# Patient Record
Sex: Female | Born: 1940 | Race: White | Hispanic: No | Marital: Married | Smoking: Never smoker
Health system: Southern US, Academic
[De-identification: ages and names within clinical notes are randomized; demographics above are authoritative.]

---

## 1992-04-19 ENCOUNTER — Other Ambulatory Visit (HOSPITAL_COMMUNITY): Payer: Self-pay | Admitting: ORTHOPAEDIC SURGERY

## 2015-10-13 IMAGING — MG MAMMO DIGITAL SCREENING
1 series · 4 of 4 positions shown · non-contrast
Comparison: Exam dated 01/31/14 and 04/02/15.

------------- REPORT [id number] -------------
MAMMO DIGITAL SCREENING WITH CAD
Exam:
Bilateral digital screening mammogram with CAD
HISTORY: Asymptomatic 74-year-old with family history of breast cancer in her sister.

[R CC · oblique · right · 4 of 4 slices shown]
[im 1/4]
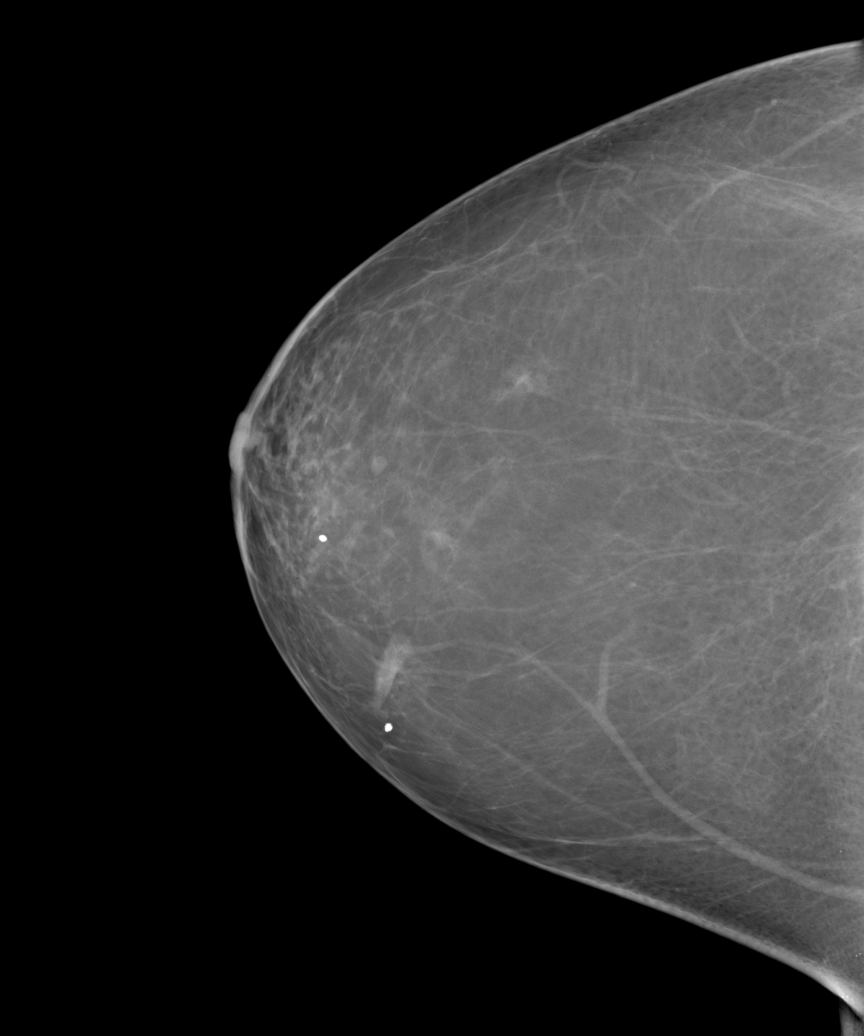
[im 2/4]
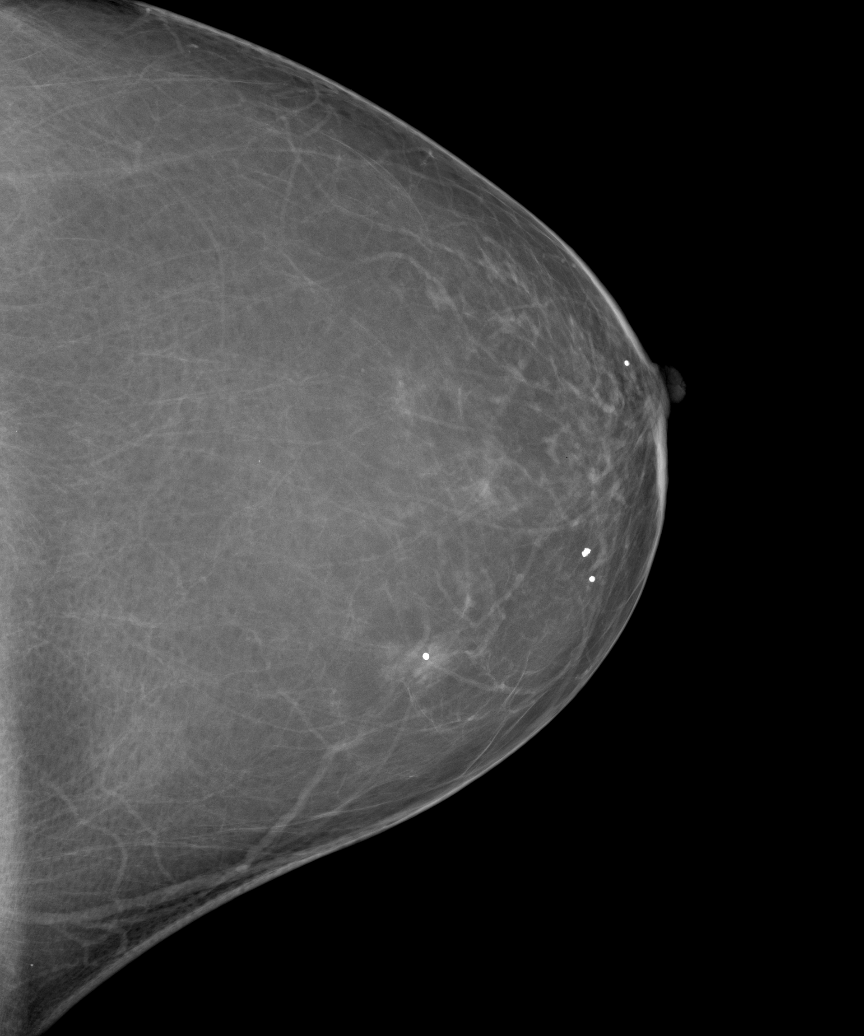
[im 3/4]
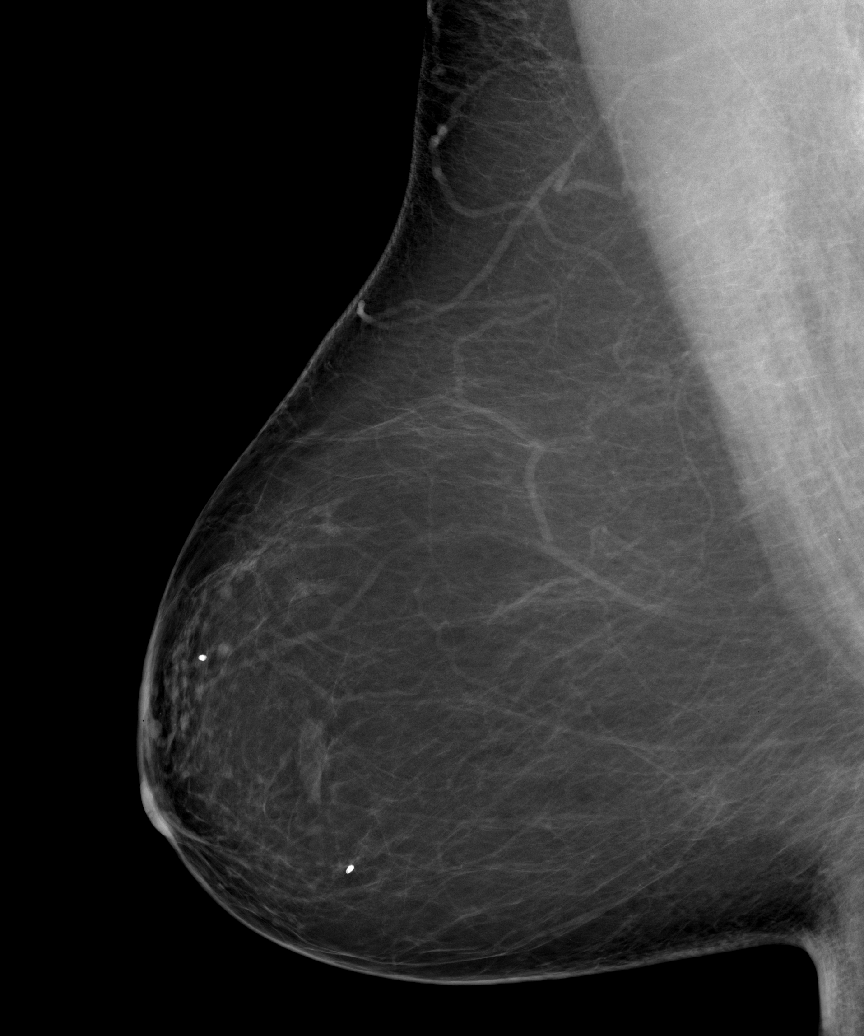
[im 4/4]
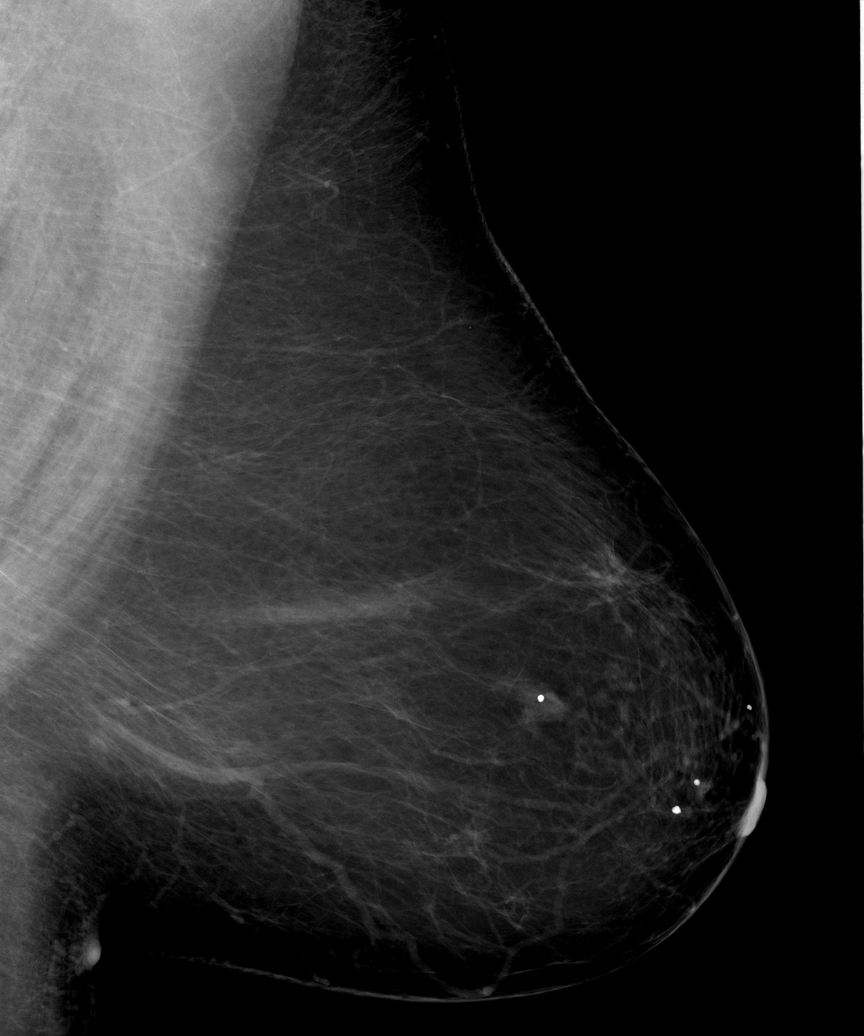

[4 of 4 positions shown; findings below may reference images not displayed]

FINDINGS: No new masses or architectural changes are seen. Small focal asymmetric densities in the medial aspect of each breast are stable in appearance. No new masses, architectural changes are seen. Benign calcific densities of both breasts are stable. No skin changes, nipple changes or duct dilation are noted.
IMPRESSION: 1.
Stable mammographic findings including patchy focal asymmetry of medial breast on each side. 
2.
Clinical followup and mammographic followup at 12 months are recommended. 

Final Assessment Code:
Bi-Rads 2, density category B

BI-RADS 0
Need additional imaging evaluation
BI-RADS 1
Negative mammogram
BI-RADS 2
Benign finding
BI-RADS 3
Probably benign finding: short-interval follow-up suggested
BI-RADS 4
Suspicious abnormality:  biopsy should be considered
BI-RADS 5
Highly suggestive of malignancy; appropriate action should be taken
BI-RADS 6
Known Biopsy-proven Malignancy  Appropriate action should be taken
NOTE:
In compliance with Federal regulations, the results of this mammogram are being sent to the patient.

------------- REPORT [id number] -------------
[facility]
[address]
Daina Ms.HENJAK, LJAMA:
We wish to report the following on your recent mammography examination. We are sending a report to your referring physician or other health care provider. 
(       Normal/Negative:
No evidence of cancer.
This statement is mandated by the [place], Department of Health.
Your examination was performed by one of our technologists, who are registered radiological technologists and also specially certified in mammography:
___
Parlak, Edaly (M)
___
Dang, Mcalex (M)

Your mammogram was interpreted by our radiologist.

( 
Sofeine Made, M.D.

(Annual Breast Examination by a physician or other health care provider
(Annual Mammography Screening beginning at age 40
(Monthly Breast Self Examination

## 2017-09-29 IMAGING — MG 3D SCREENING MAMMO BIL W/CAD
5 series · 7 of 24 positions shown · non-contrast
Comparison: 03/23/2016 and 03/22/2015.

------------- REPORT [id number] -------------
[facility]
[address]
Daina Ms.HENJAK, LJAMA:
We wish to report the following on your recent mammography examination. We are sending a report to your referring physician or other health care provider. 
(       Normal/Negative:
No evidence of cancer.
This statement is mandated by the [place], Department of Health.
Your examination was performed by one of our technologists, who are registered radiological technologists and also specially certified in mammography:
___
Parlak, Edaly (M)
Dang, Mcalex (M)

Your mammogram was interpreted by our radiologist.
( 
Sofeine Made, M.D.
(Annual Breast Examination by a physician or other health care provider
(Annual Mammography Screening beginning at age 40
(Monthly Breast Self Examination
------------- REPORT [id number] -------------
Exam:  
Annual Screening Digital Mammogram with 3D Tomosynthesis with CAD
INDICATION: Screening.

[R]
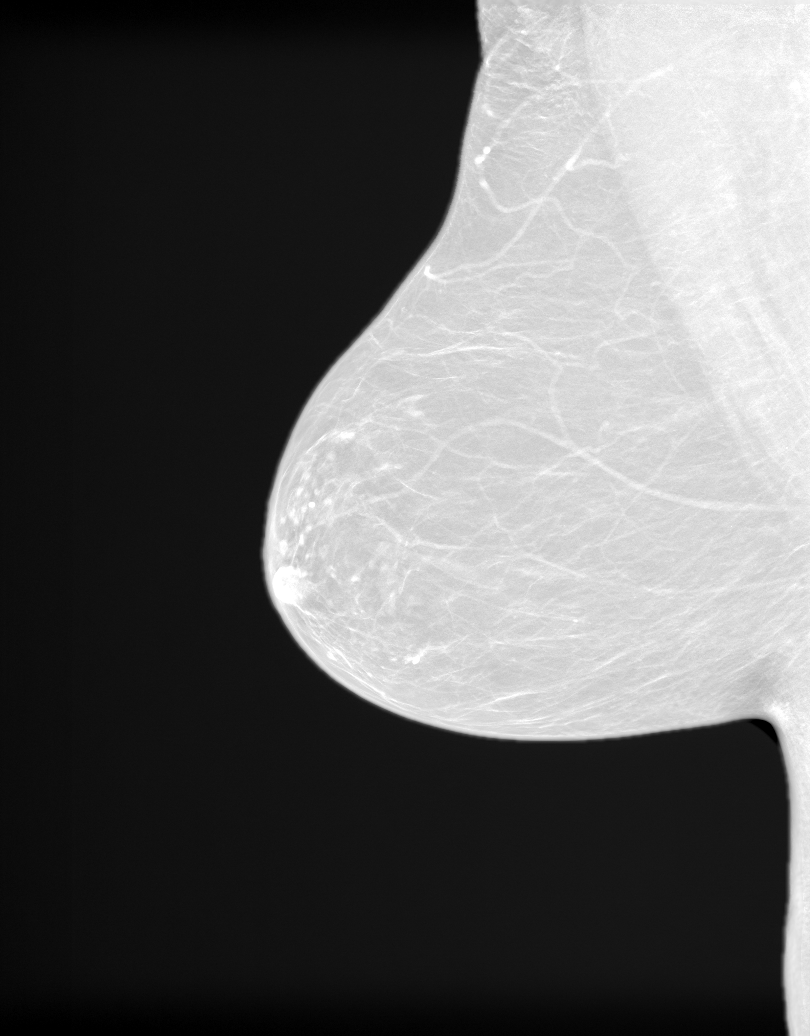

[R CC · right · 0.10mm/px · 2 of 2 slices shown]
[im 1/2]
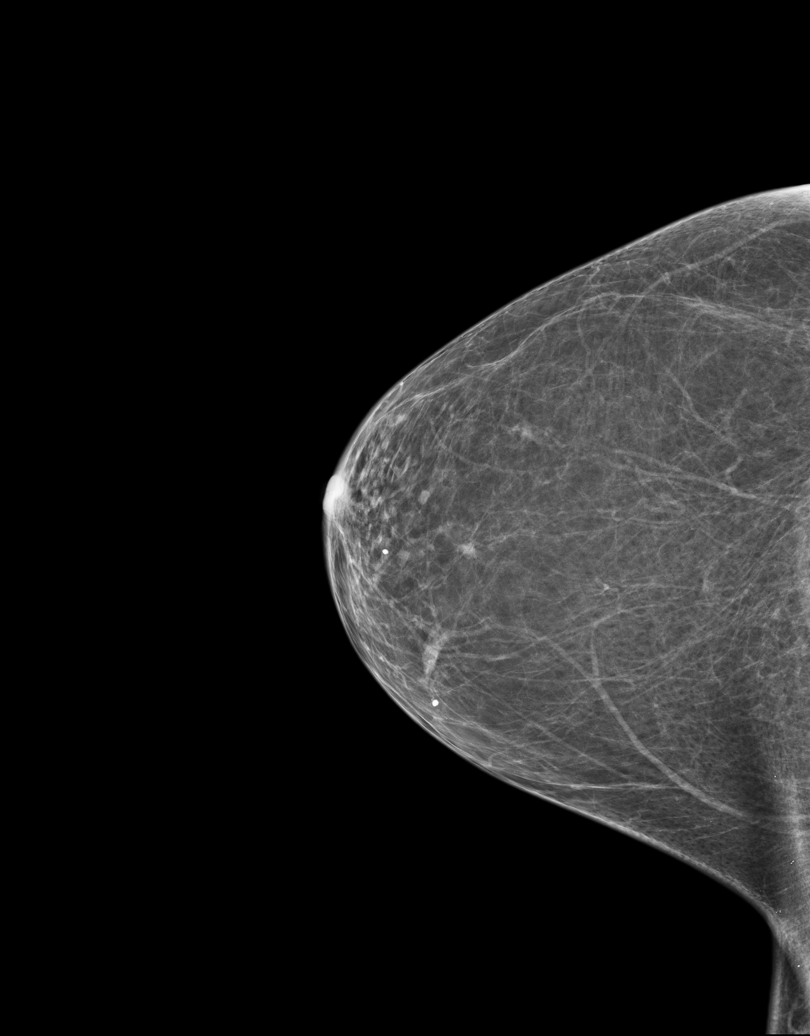
[im 2/2]
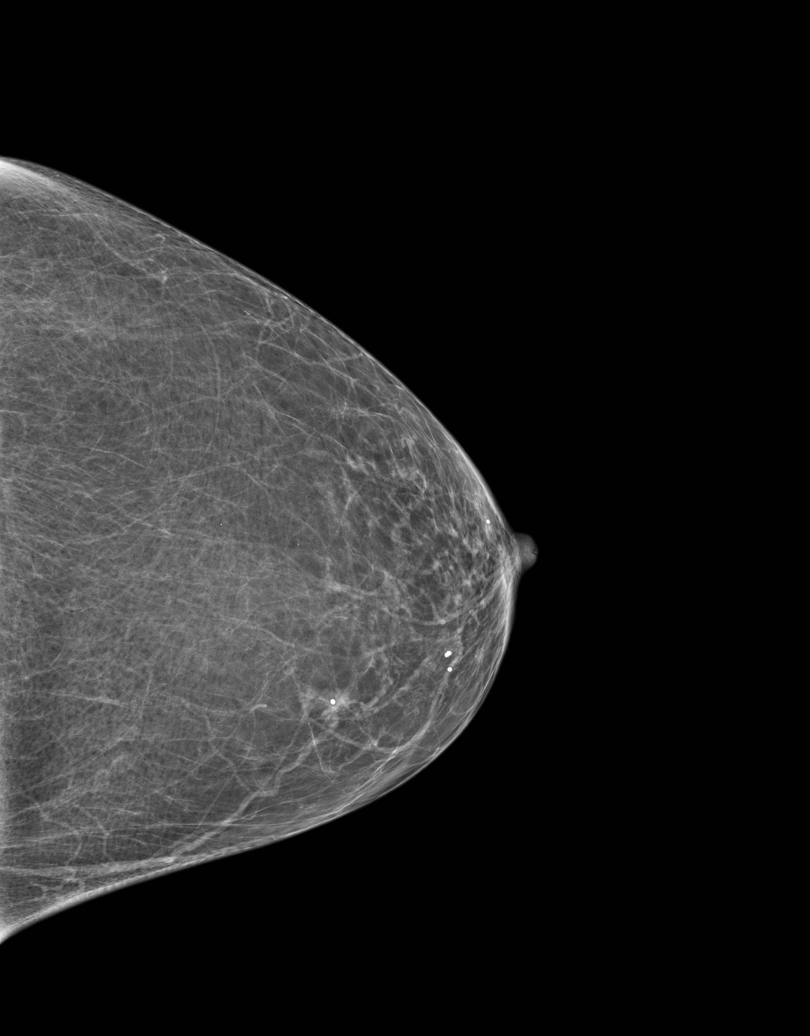

[3D SCREENING MAMMO BIL W/CAD · 2 acquisitions, 2 frames shown (1 of 2)]
[im 1/2]
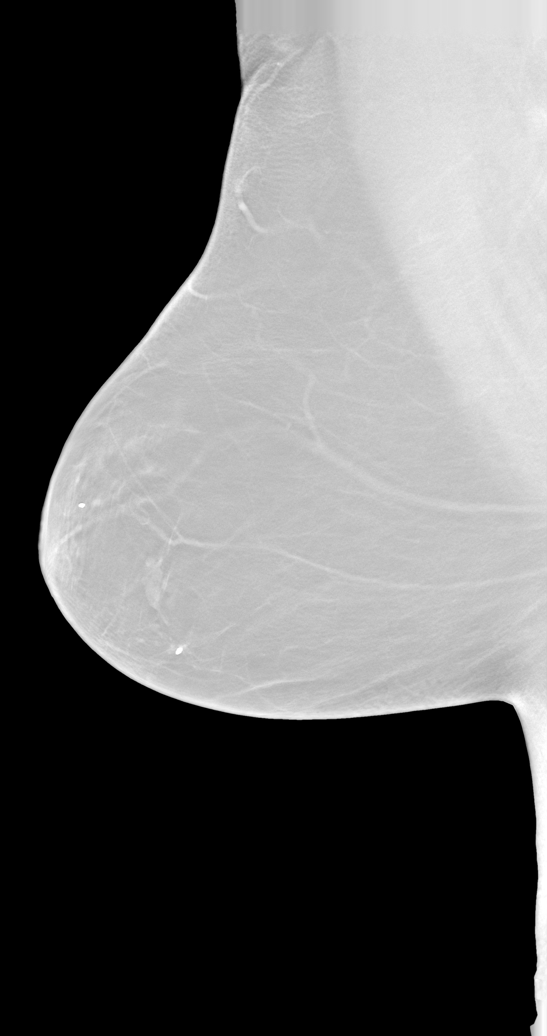
[im 2/2]
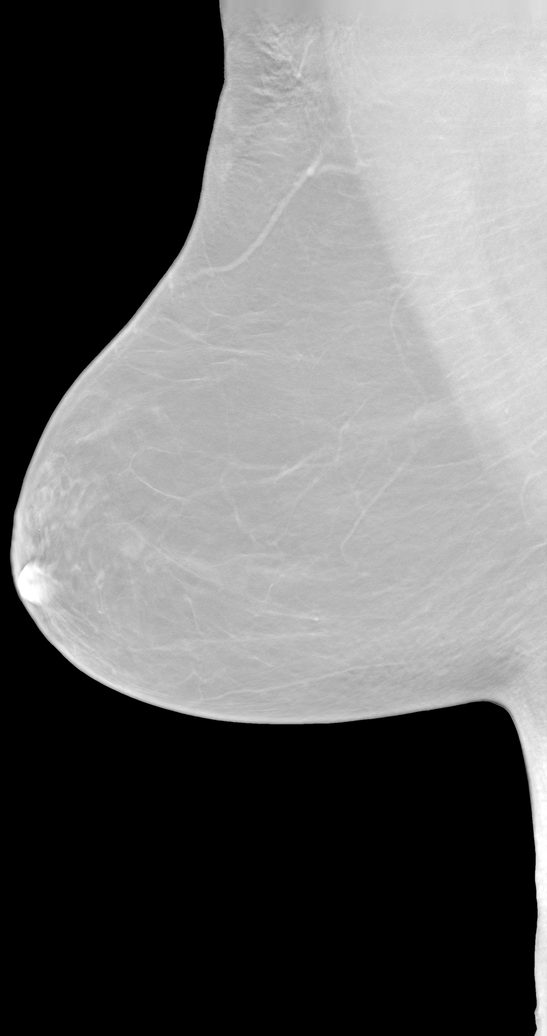

[3D SCREENING MAMMO BIL W/CAD (2 of 2) · tomo slice 9/55.0]
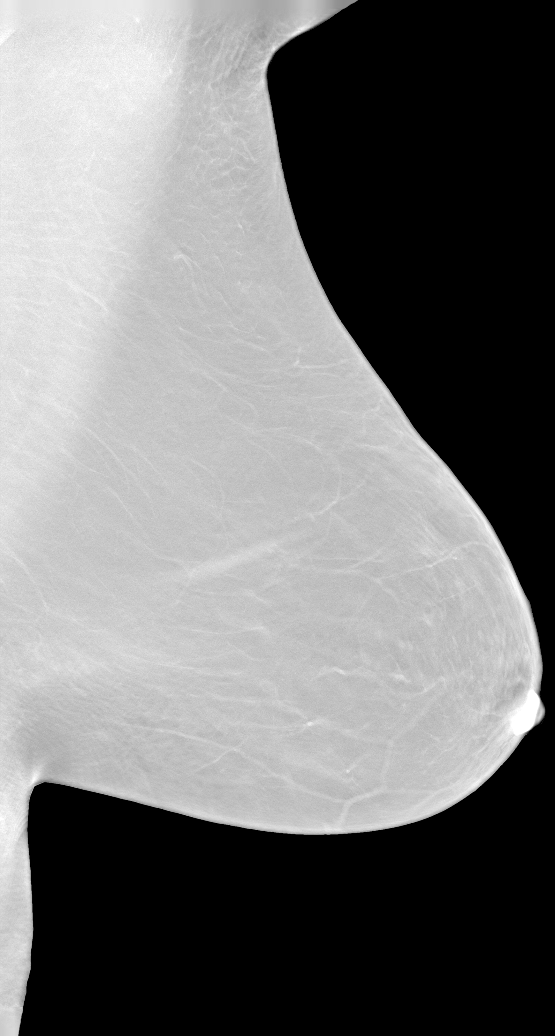

[L]
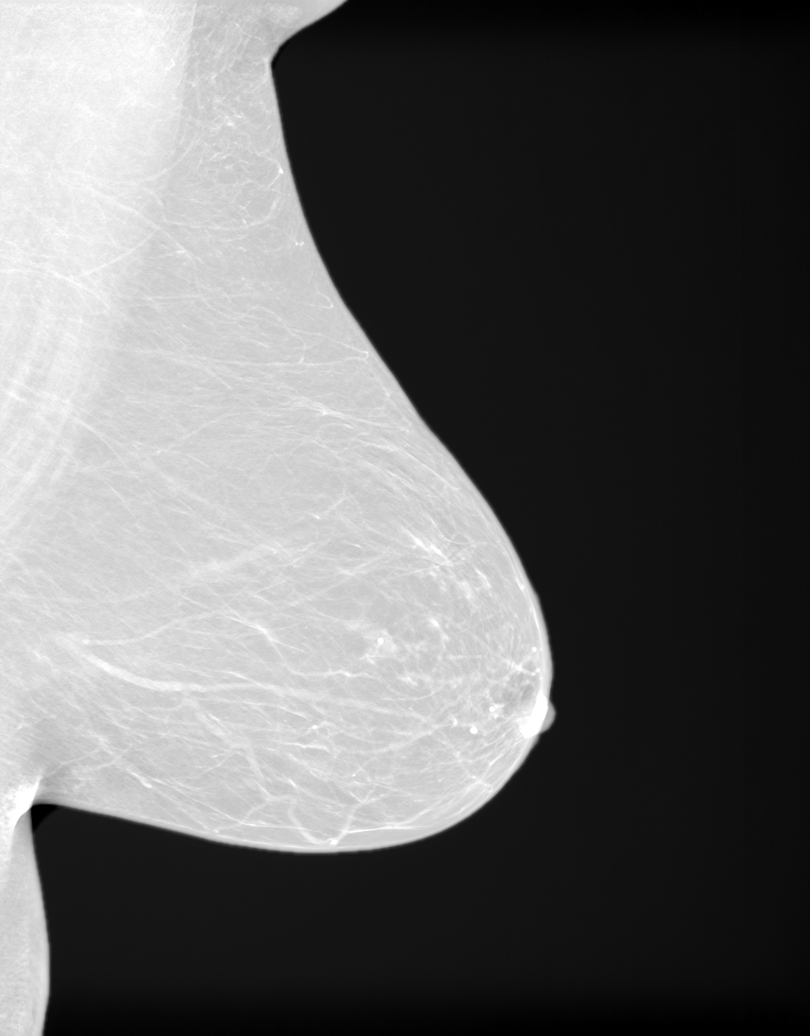

[7 of 24 positions shown; findings below may reference images not displayed]

FINDINGS: There are scattered fibroglandular elements.  There is no mass or suspicious cluster of microcalcifications.   There is no architectural distortion, skin thickening or nipple retraction.
IMPRESSION: BIRADS 2-Benign findings. Patient has been added in a reminder system with a

target date for the next screening mammography.

DENSITY CODE –  B (Scattered areas of fibroglandular density). 

Final Assessment Code:

Bi-Rads 2 

BI-RADS 0
Need additional imaging evaluation

BI-RADS 1
Negative mammogram

BI-RADS 2
Benign finding

BI-RADS 3
Probably benign finding: short-interval follow-up suggested

BI-RADS 4
Suspicious abnormality:  biopsy should be considered

BI-RADS 5
Highly suggestive of malignancy; appropriate action should be taken

BI-RADS 6
Known Biopsy-proven Malignancy – Appropriate action should be taken

NOTE:
In compliance with Federal regulations, the results of this mammogram are being sent to the patient.

## 2018-03-16 IMAGING — CR XRAY LUMBAR SPINE COMPLETE
1 series · 6 of 6 positions shown · non-contrast
Comparison: None available.

EXAM:  XRAY LUMBAR SPINE COMPLETE
INDICATION: Low back pain.

[Series 3: view not recorded · 0.17mm/px · 6 of 6 slices shown]
[im 1/6]
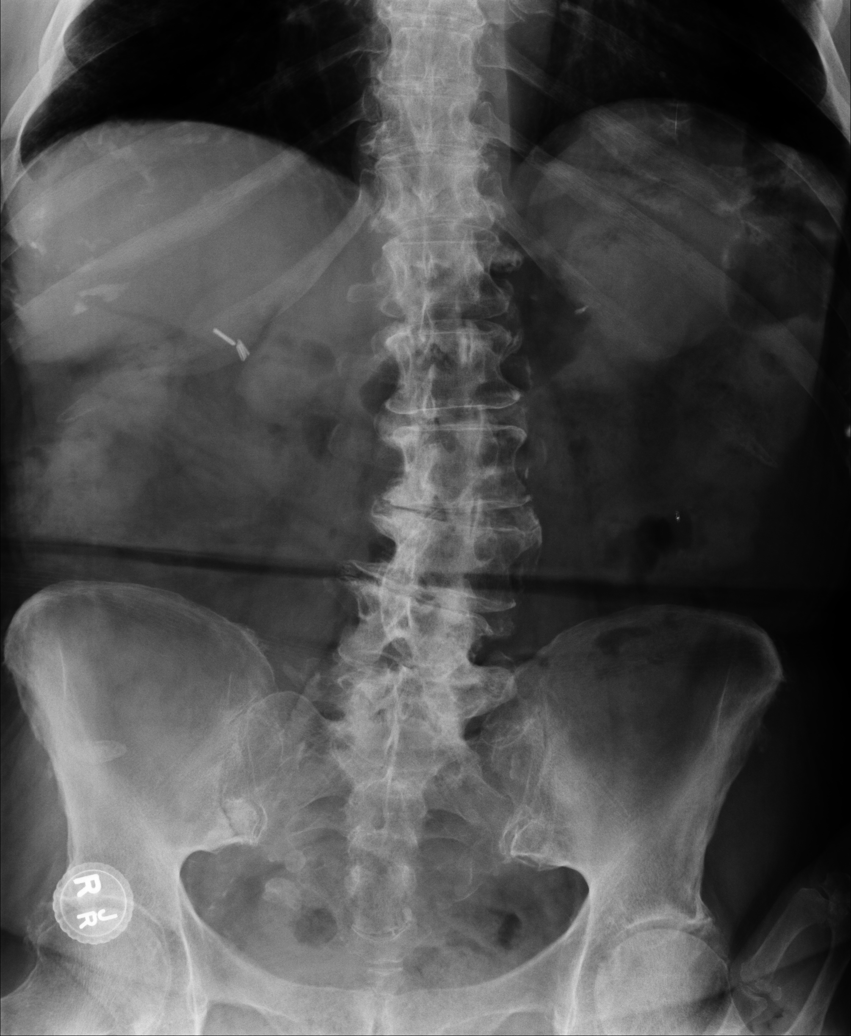
[im 2/6]
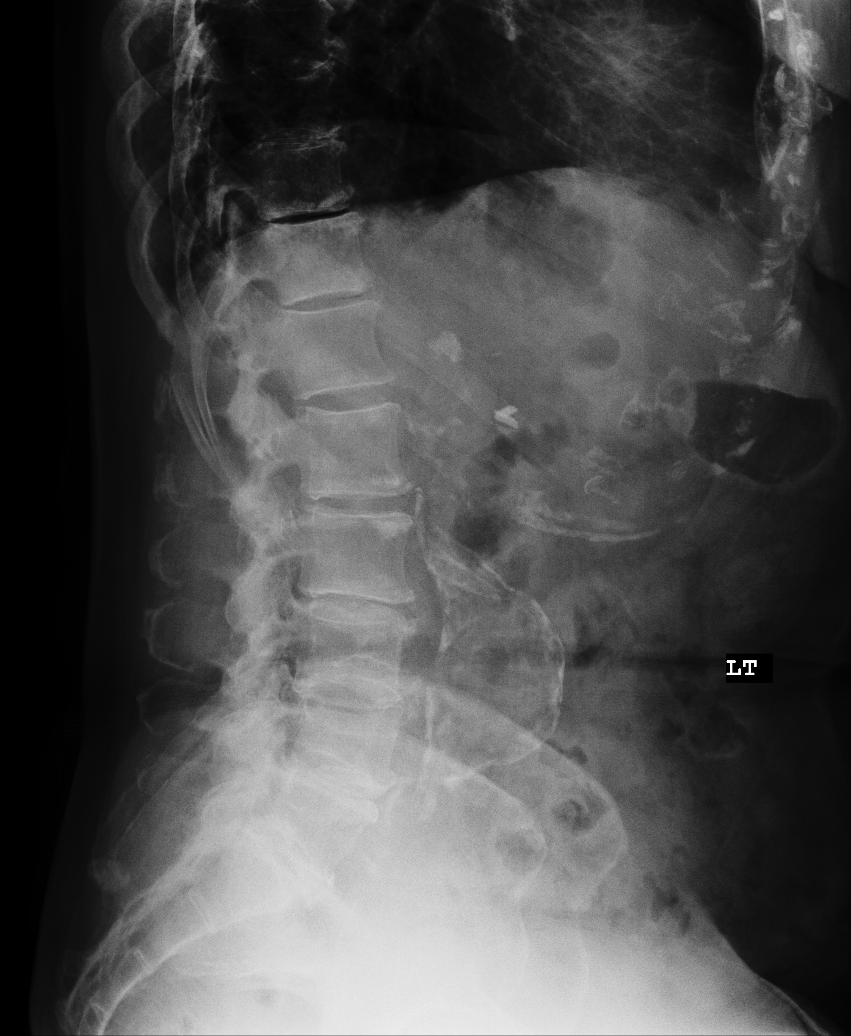
[im 3/6]
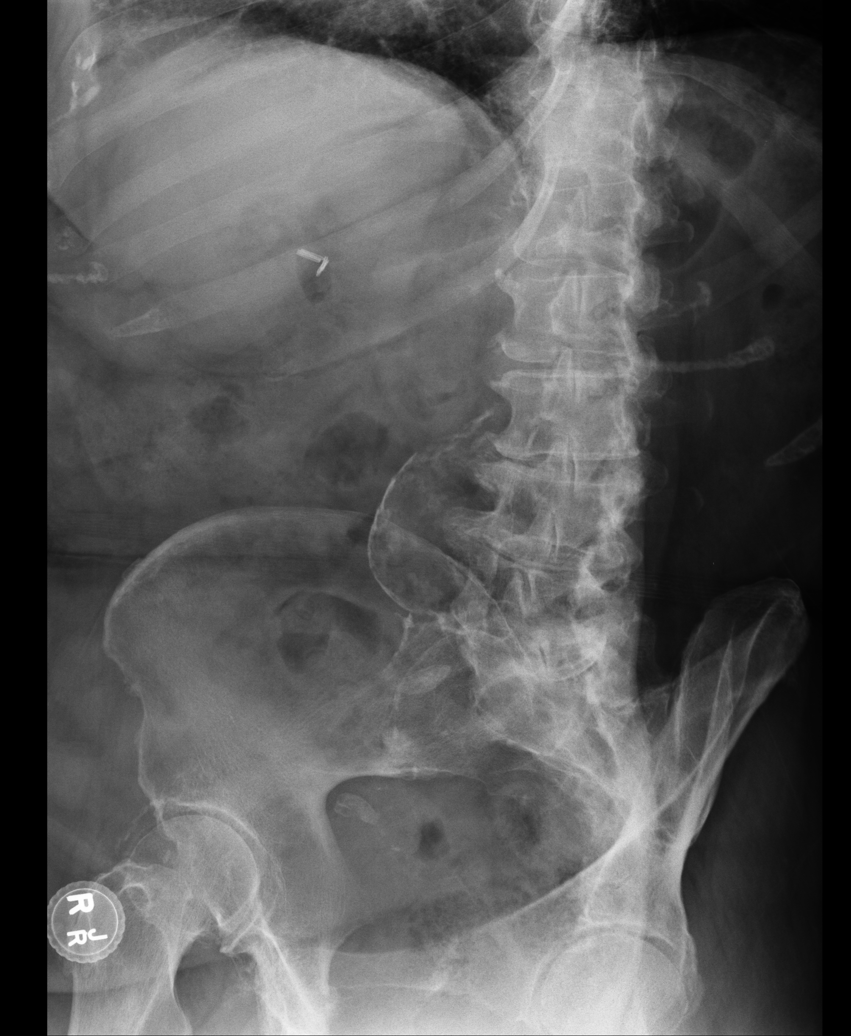
[im 4/6]
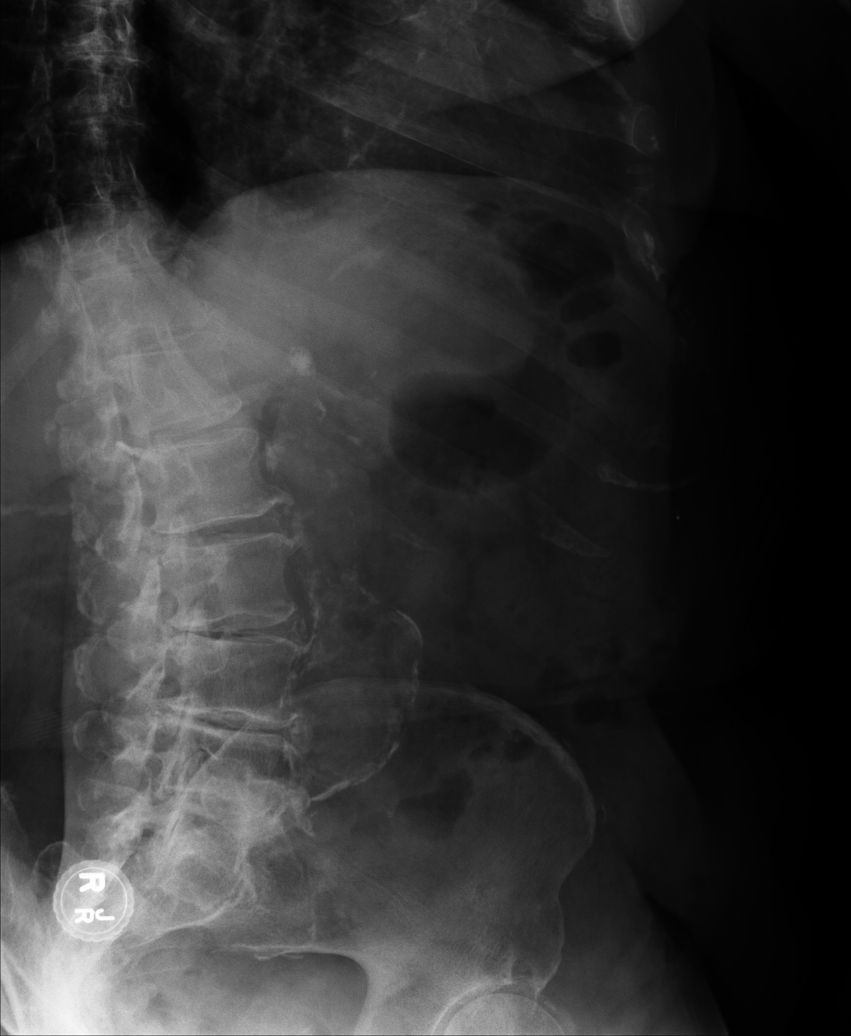
[im 5/6]
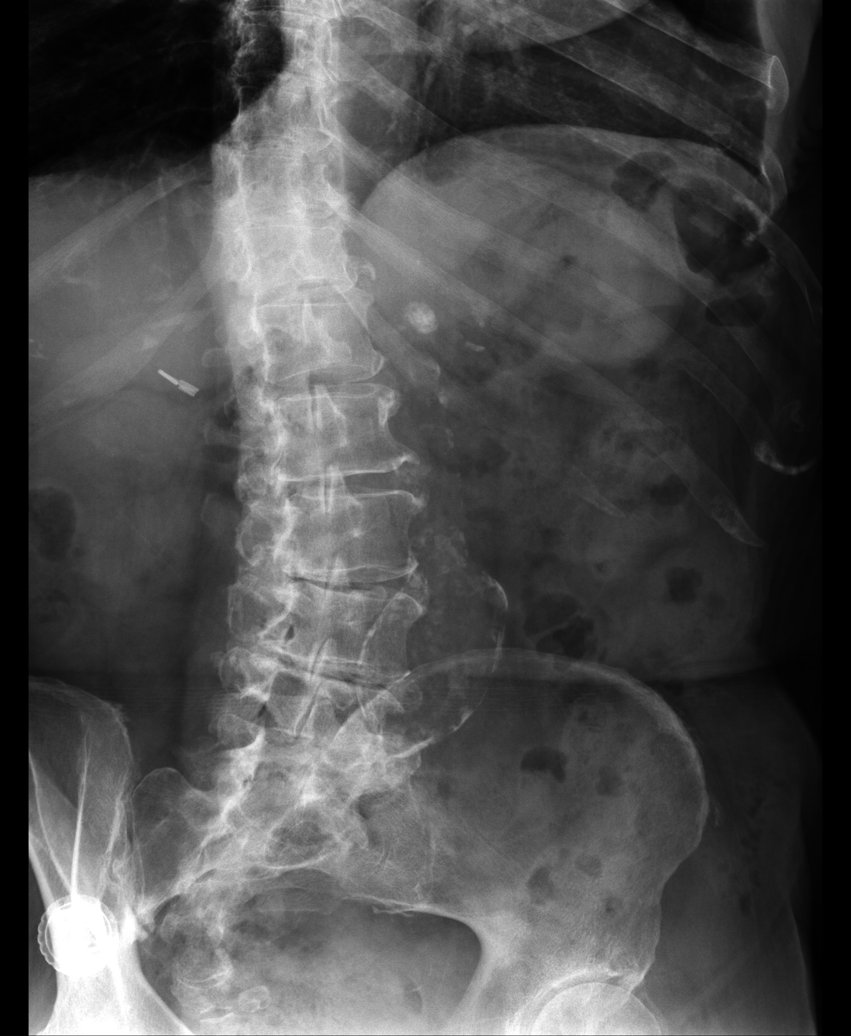
[im 6/6]
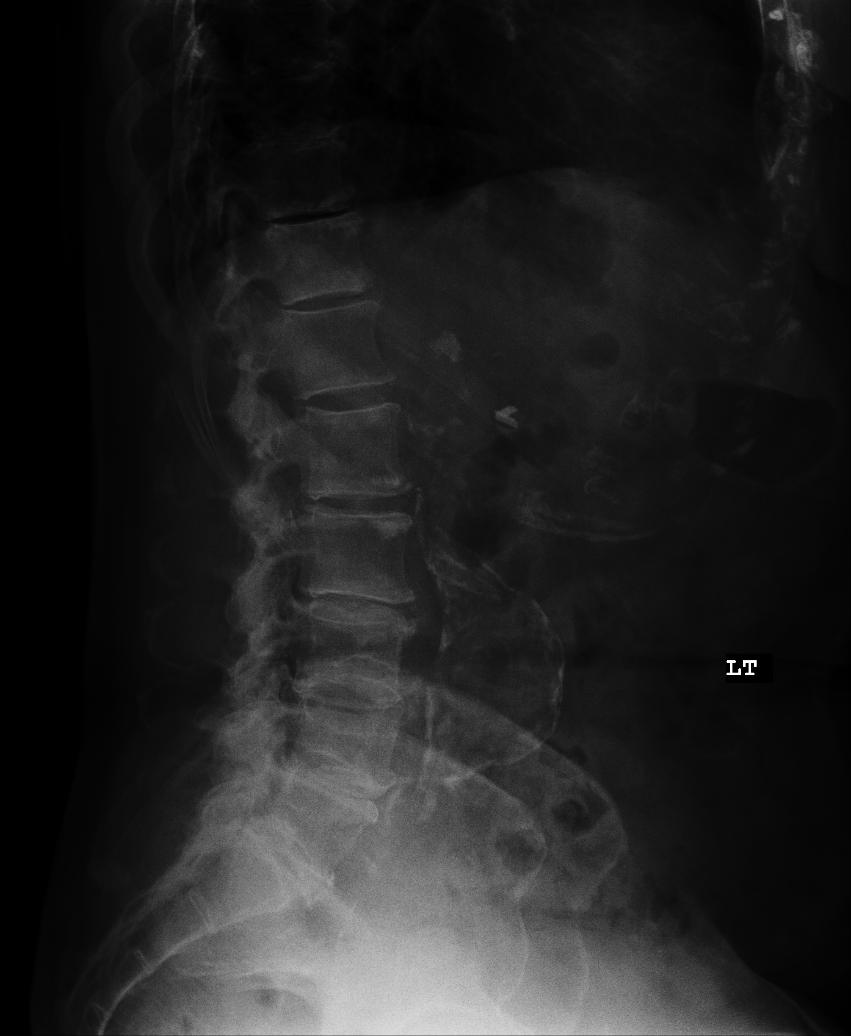

[6 of 6 positions shown; findings below may reference images not displayed]

FINDINGS: There are 6 non-rib-bearing lumbar vertebral bodies. There is a mild levoscoliosis centered at L3-4 disc space level. There is no acute fracture or subluxation. Moderate to severe degenerative disc disease is seen at L3-4, L4-5, L5-6 and L6-S1 levels. There is also extensive facet arthropathy within the lower lumbar spine. Paraspinal soft tissues are unremarkable. There is extensive atherosclerotic disease. There is also a 5.5 cm heavily calcified abdominal aortic aneurysm. Cholecystectomy clips are also noted.
IMPRESSION: Advanced multilevel arthritic changes as detailed above.

A 5.5 cm heavily calcified abdominal aortic aneurysm.

## 2018-03-16 IMAGING — CR XRAY THORACIC SPINE 2 VIEWS
1 series · 4 of 4 positions shown · non-contrast
Comparison: None available.

EXAM:  XRAY THORACIC SPINE 2 VIEWS
INDICATION: Mid back pain.

[Series 1: view not recorded · 0.17mm/px · 4 of 4 slices shown]
[im 1/4]
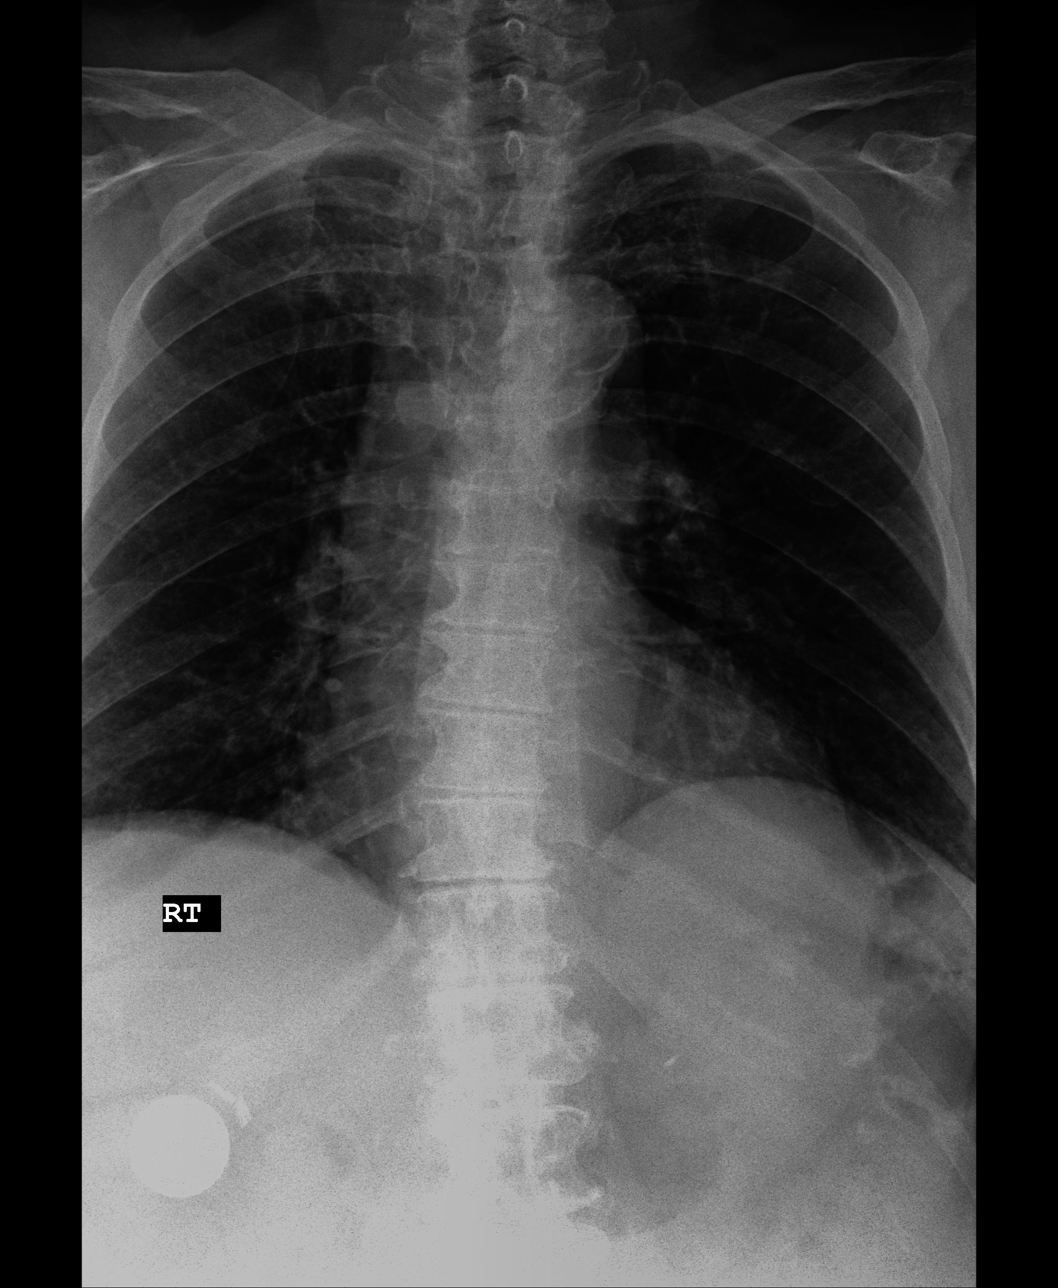
[im 2/4]
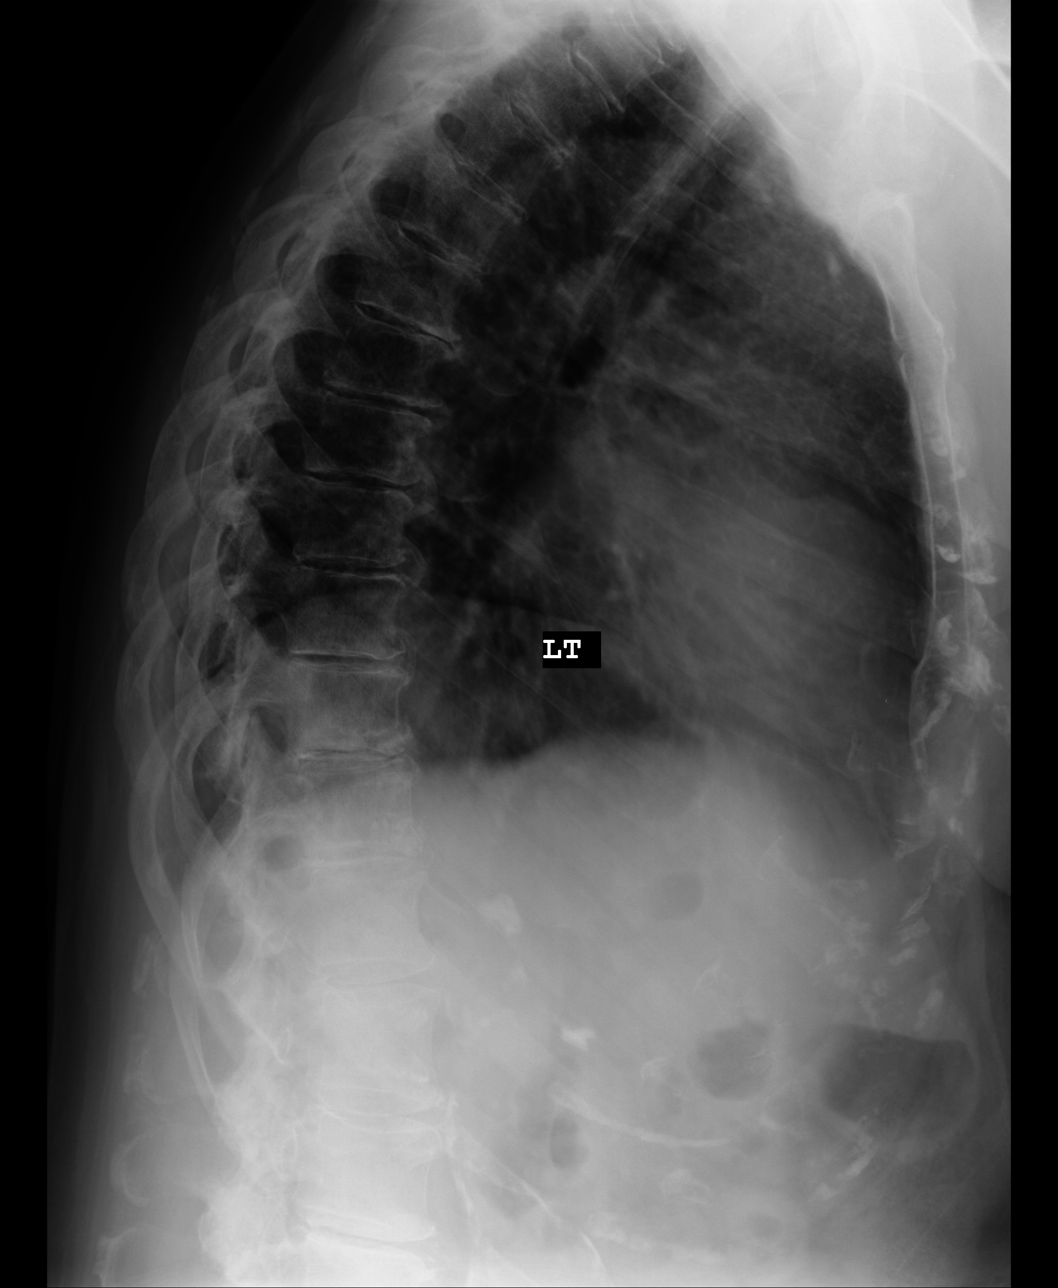
[im 3/4]
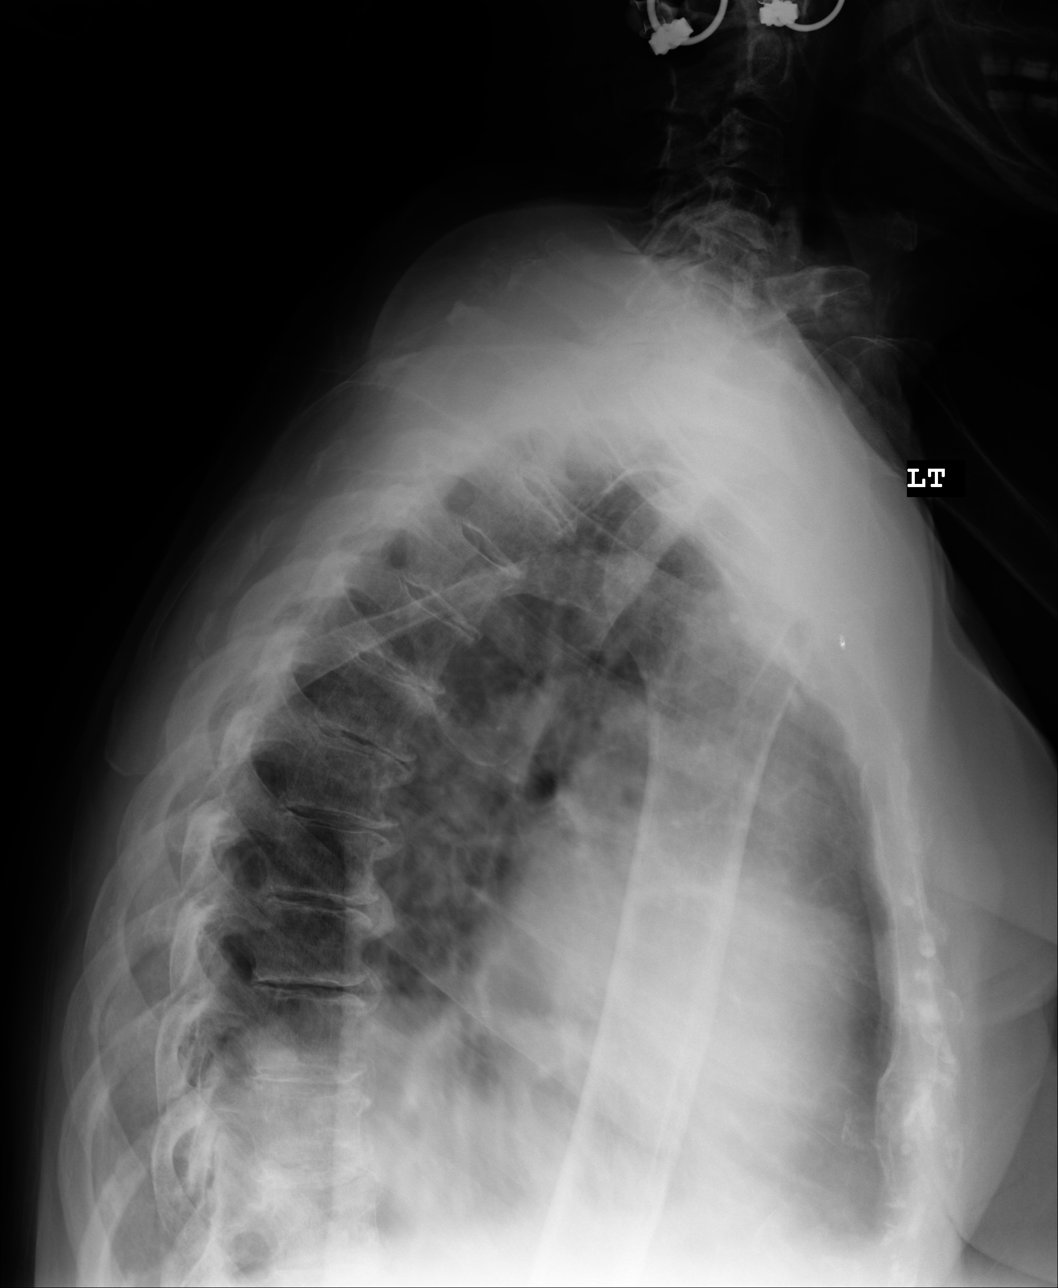
[im 4/4]
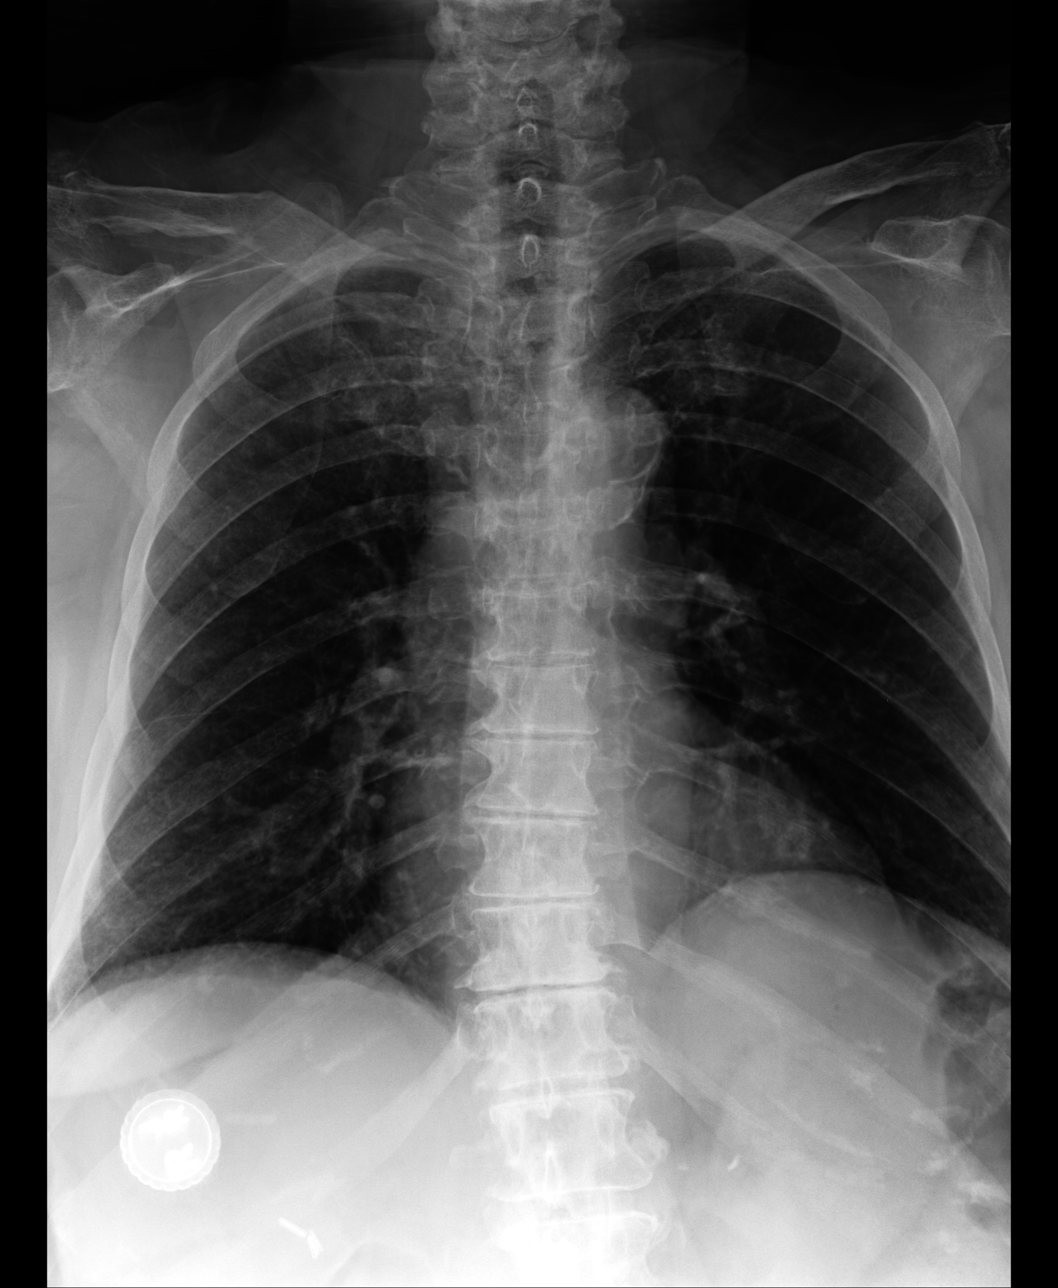

[4 of 4 positions shown; findings below may reference images not displayed]

FINDINGS: Thoracic vertebral bodies are normal in height and alignment. There is no acute fracture or subluxation. Mild-to-moderate degenerative disc disease is seen at most levels within the thoracic spine. Paraspinal soft tissues are unremarkable. There are scattered vascular calcifications.
IMPRESSION: Multilevel degenerative changes as detailed above.

## 2019-01-14 IMAGING — MG 3D SCREENING MAMMO BIL W/CAD
5 series · 7 of 24 positions shown · non-contrast
Comparison: 08/01/2018

------------- REPORT [id number] -------------
EXAM:  3D BILATERAL ANNUAL SCREENING DIGITAL MAMMOGRAM WITH TOMOSYNTHESIS AND CAD
INDICATION: Screening.

[R CC · right · 0.10mm/px · 2 of 2 slices shown]
[im 1/2]
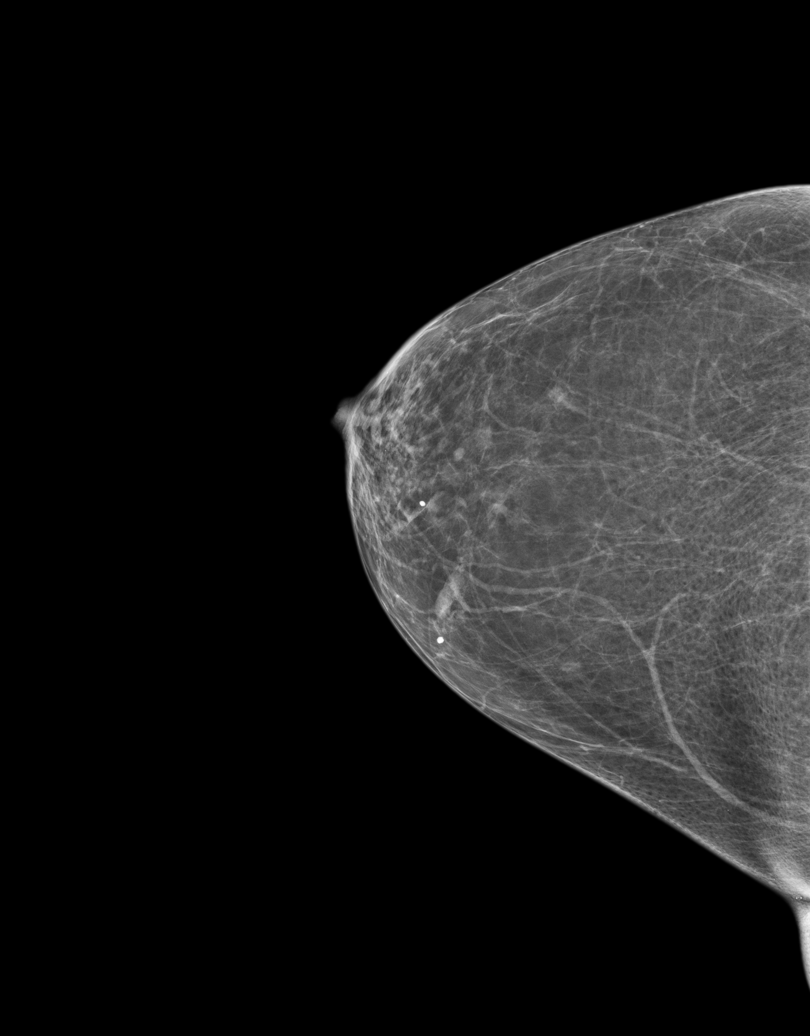
[im 2/2]
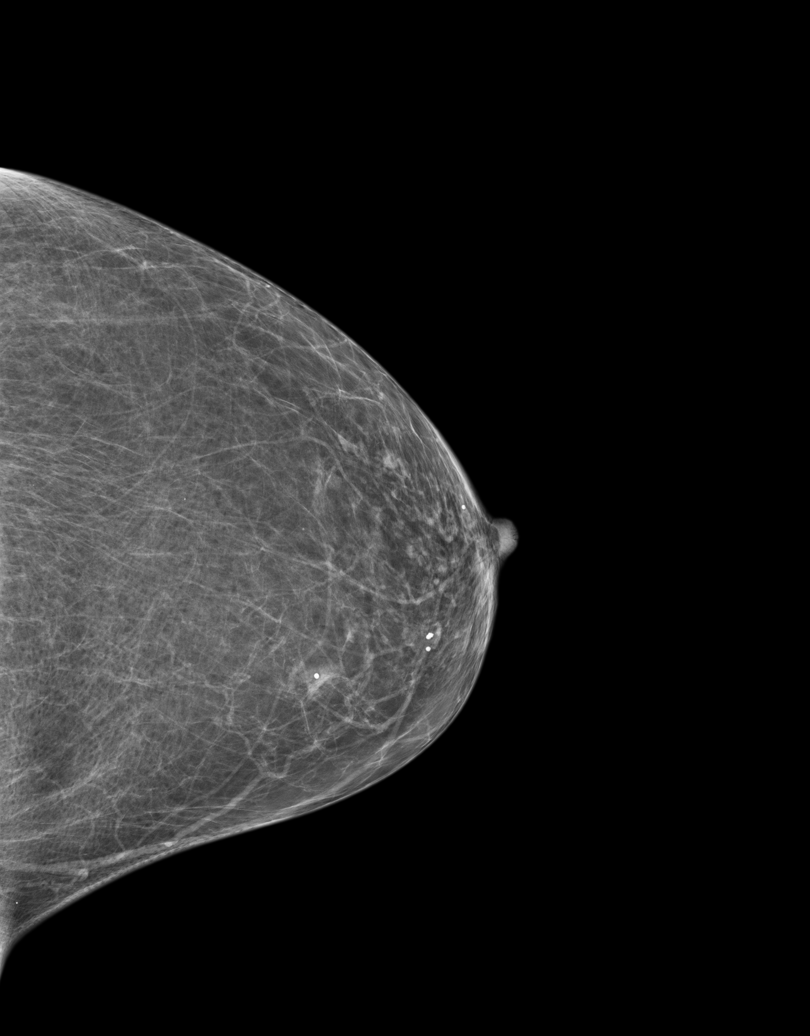

[3D SCREENING MAMMO BIL W/CAD · 2 acquisitions, 2 frames shown (1 of 2)]
[im 1/2]
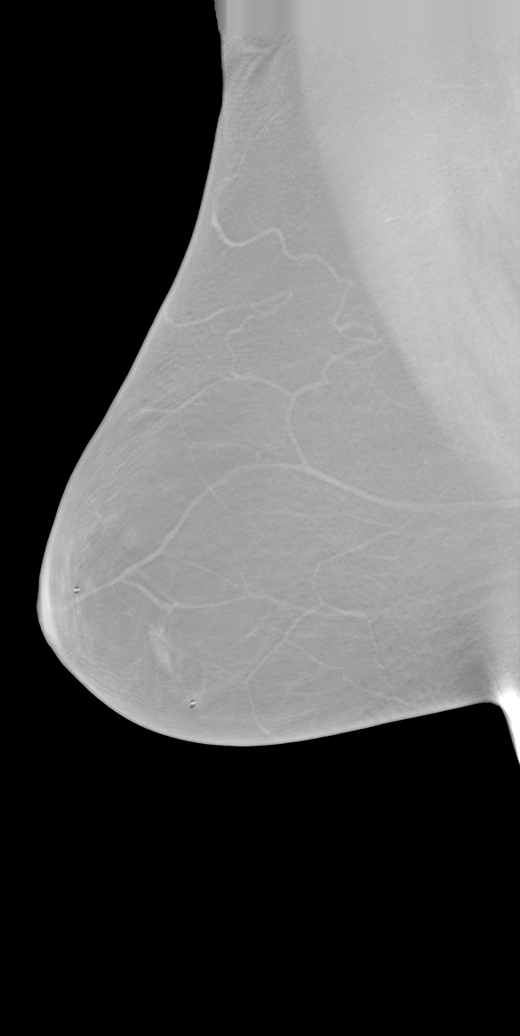
[im 2/2]
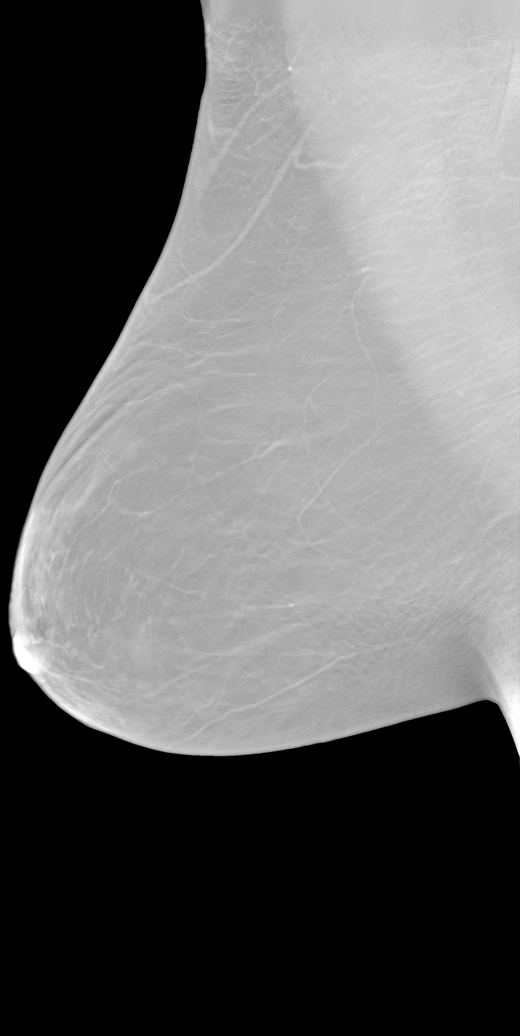

[R]
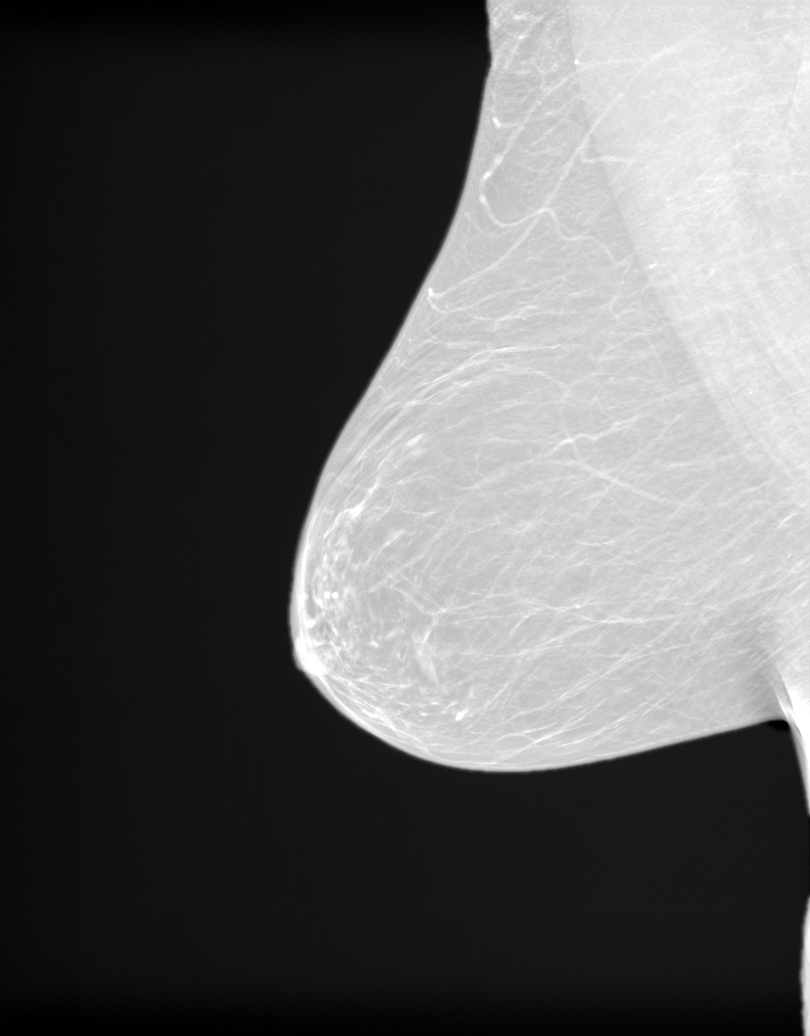

[3D SCREENING MAMMO BIL W/CAD (2 of 2) · tomo slice 8/47.0]
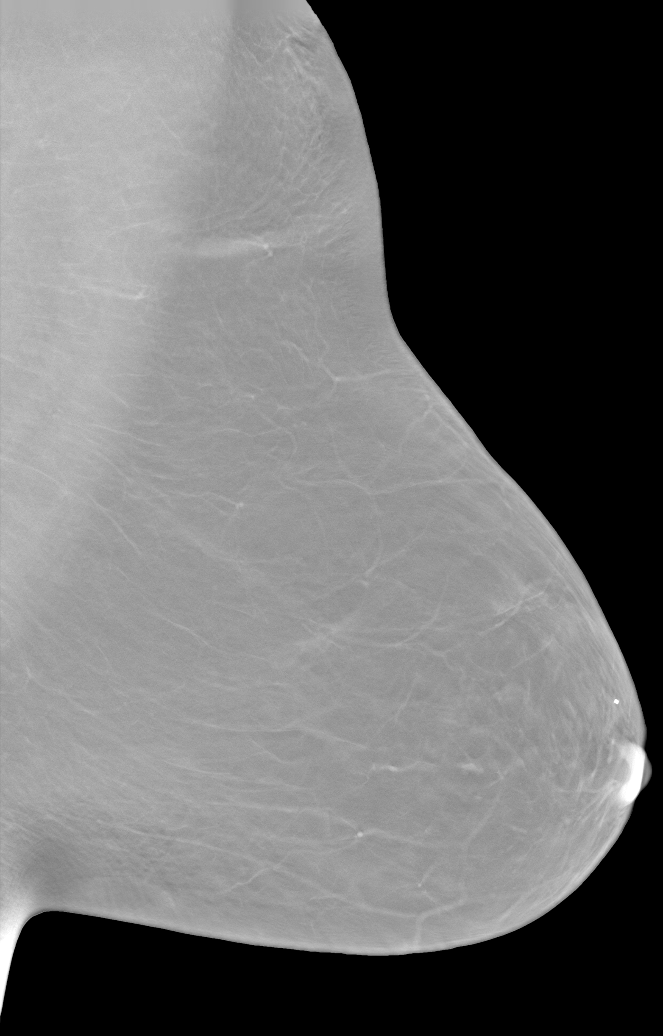

[L]
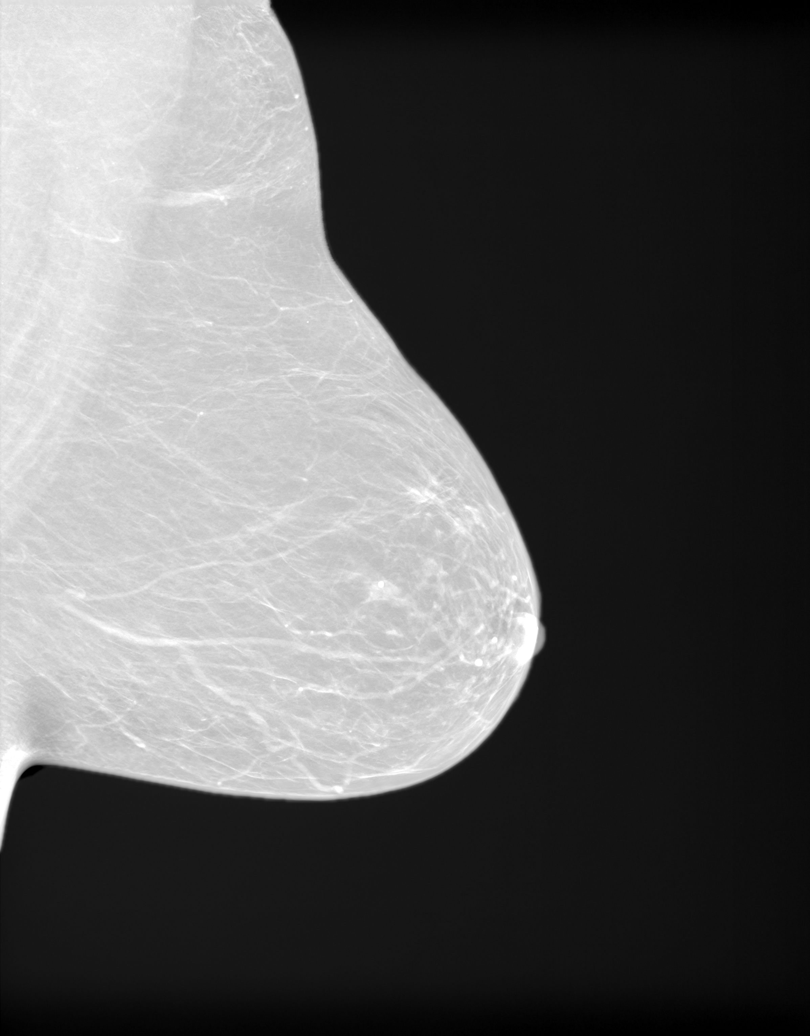

[7 of 24 positions shown; findings below may reference images not displayed]

FINDINGS: A screening mammogram is performed.  The examination shows involutional changes consisting of fatty replacement of the glandular tissue bilaterally.  There are coarse benign calcifications bilaterally.  When compared with the earlier study dated 08/01/2018, no interval development of a focal mass or malignant calcifications is seen.  The coarse benign calcifications are stable on the current exam.  3D tomosynthesis shows no evidence for an occult mass or calcifications.
IMPRESSION: 1.  Normal stable screening mammogram. 

2.  ACR BREAST DENSITY:  A (Almost entirely fatty).

3.  BIRADS 1-Negative mammogram. Patient has been added in a reminder system with a target date for the next screening mammography.

4.  Management recommendation:  Routine mammographic screening.   

Final Assessment Code:

Bi-Rads 1

BI-RADS 0
Need additional imaging evaluation

BI-RADS 1
Negative mammogram

BI-RADS 2
Benign finding

BI-RADS 3
Probably benign finding; short-interval follow-up suggested

BI-RADS 4
Suspicious abnormality; biopsy should be considered

BI-RADS 5
Highly suggestive of malignancy; appropriate action should be taken

BI-RADS 6
Known biopsy-proven malignancy; appropriate action should be taken 

NOTE:
In compliance with Federal regulations, the results of this mammogram are being sent to the patient.

------------- REPORT [id number] -------------
[facility]
[address]
Daina Ms.HENJAK, LJAMA:
We wish to report the following on your recent mammography examination. We are sending a report to your referring physician or other health care provider. 
(       Normal/Negative:
No evidence of cancer.
This statement is mandated by the [place], Department of Health.
Your examination was performed by one of our technologists, who are registered radiological technologists and also specially certified in mammography:
___
Parlak, Edaly (M)
___
Dang, Mcalex (M)

Your mammogram was interpreted by our radiologist.

( 
Sofeine Made, M.D.

(Annual Breast Examination by a physician or other health care provider
(Annual Mammography Screening beginning at age 40
(Monthly Breast Self Examination

## 2020-03-30 IMAGING — MG 3D SCREENING MAMMO BIL W/CAD
5 series · 7 of 24 positions shown · non-contrast
Comparison: 06/01/2019

------------- REPORT [id number] -------------
[facility]
[address]
Daina Ms.HENJAK, LJAMA:
We wish to report the following on your recent mammography examination. We are sending a report to your referring physician or other health care provider. 
(       Normal/Negative:
No evidence of cancer.
This statement is mandated by the [place], Department of Health.
Your examination was performed by one of our technologists, who are registered radiological technologists and also specially certified in mammography:
___
Parlak, Edaly (M)
Nepomuceno, Martinez (M)

Your mammogram was interpreted by our radiologist.
( 
Sofeine Made, M.D.
(Annual Breast Examination by a physician or other health care provider
(Annual Mammography Screening beginning at age 40
(Monthly Breast Self Examination
------------- REPORT [id number] -------------
EXAM:  3D BILATERAL ANNUAL SCREENING DIGITAL MAMMOGRAM WITH CAD AND TOMOSYNTHESIS
INDICATION: Screening.

[Series 5281: R CC · right · 2 of 2 slices shown]
[im 1/2]
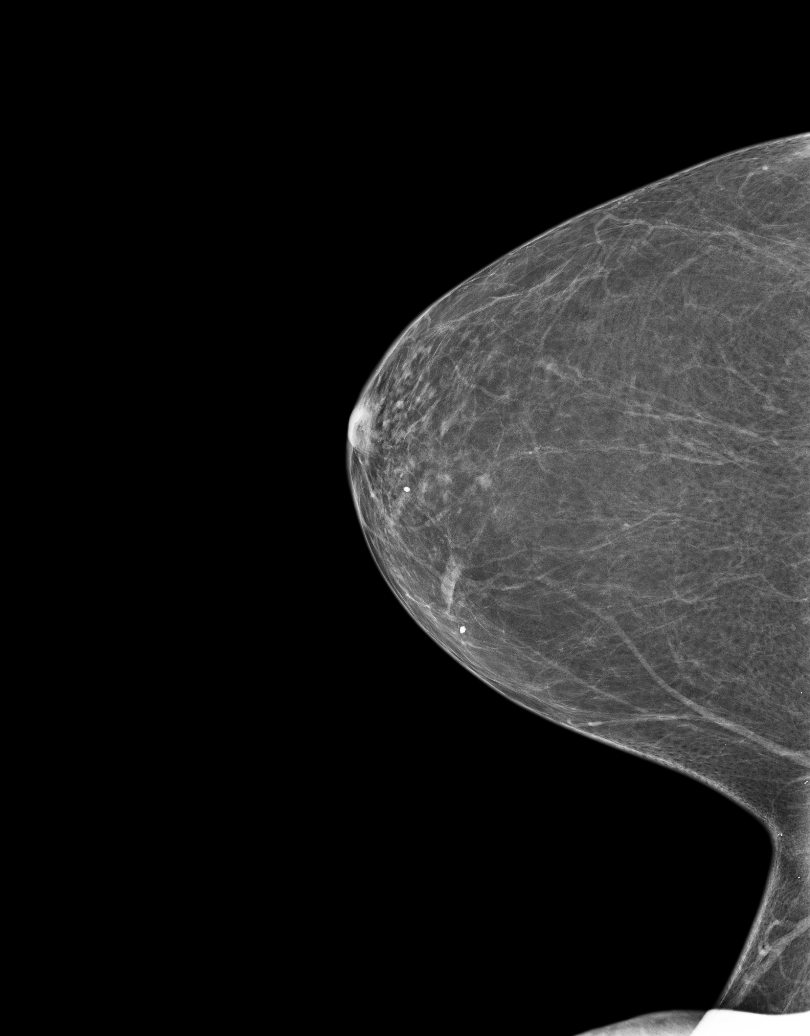
[im 2/2]
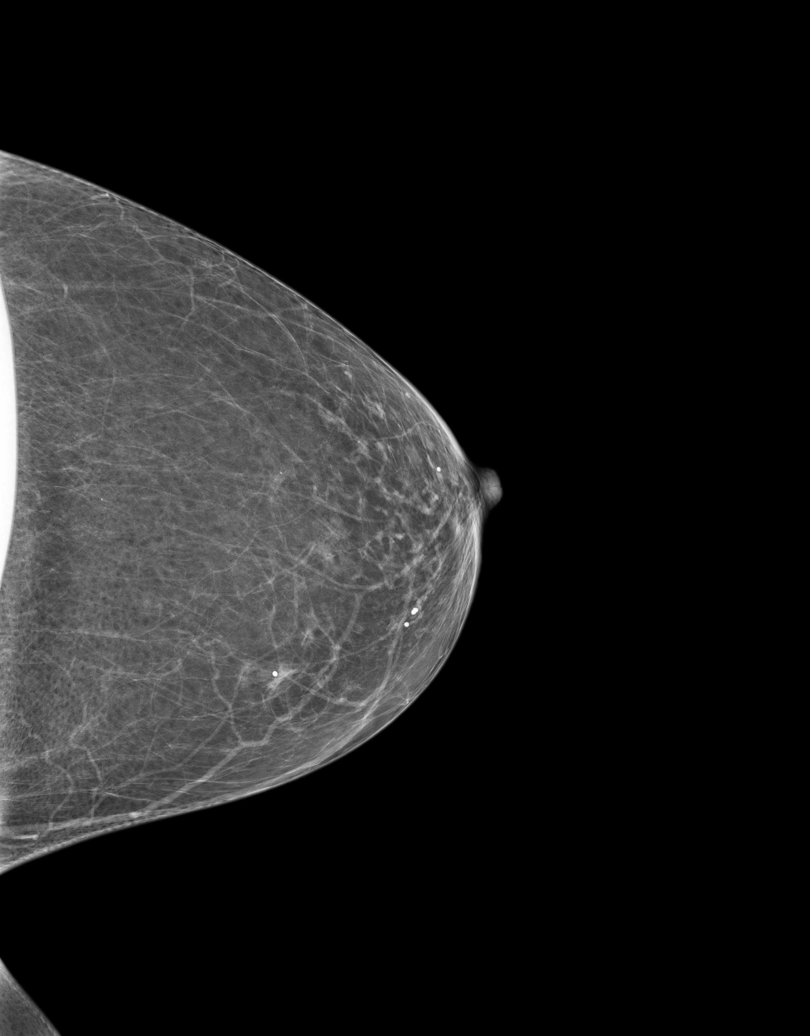

[Series 5283: 3D SCREENING MAMMO BIL W/CAD · 2 acquisitions, 2 frames shown (1 of 2)]
[im 1/2]
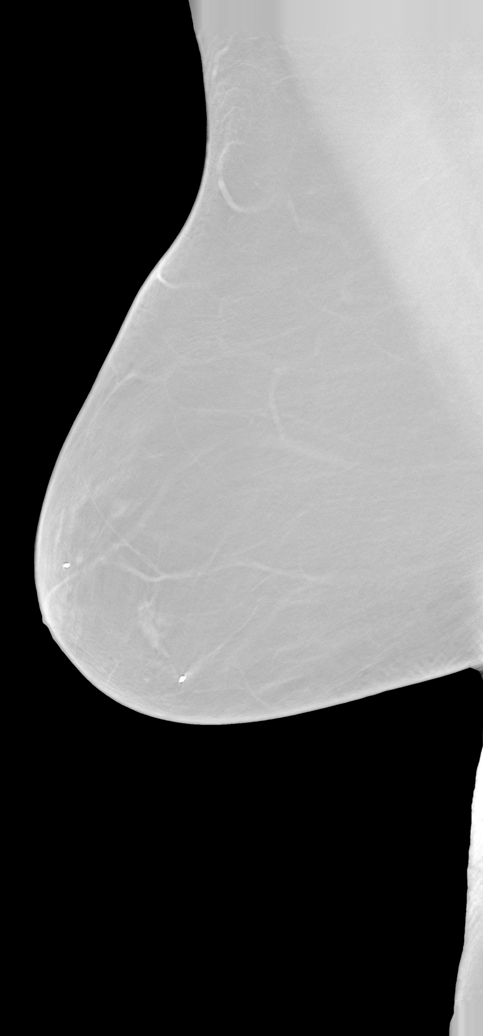
[im 2/2]
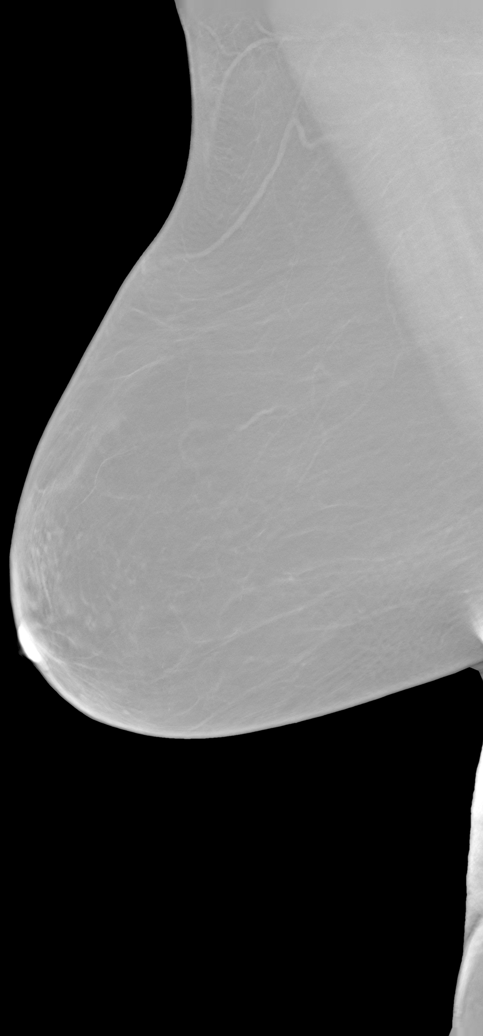

[3D SCREENING MAMMO BIL W/CAD (2 of 2) · tomo slice 9/53.0]
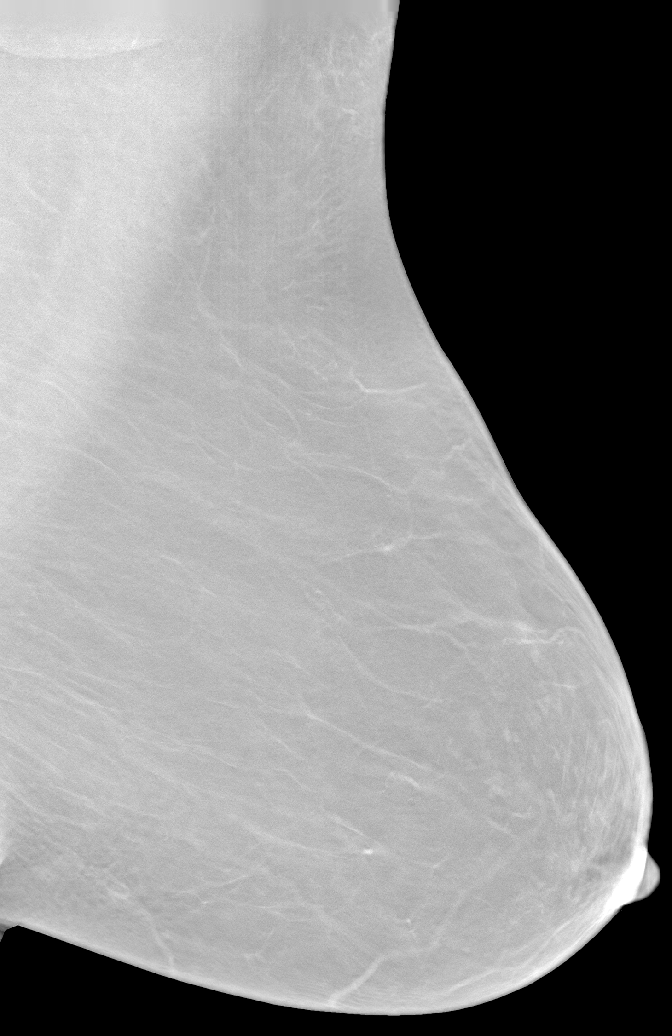

[R]
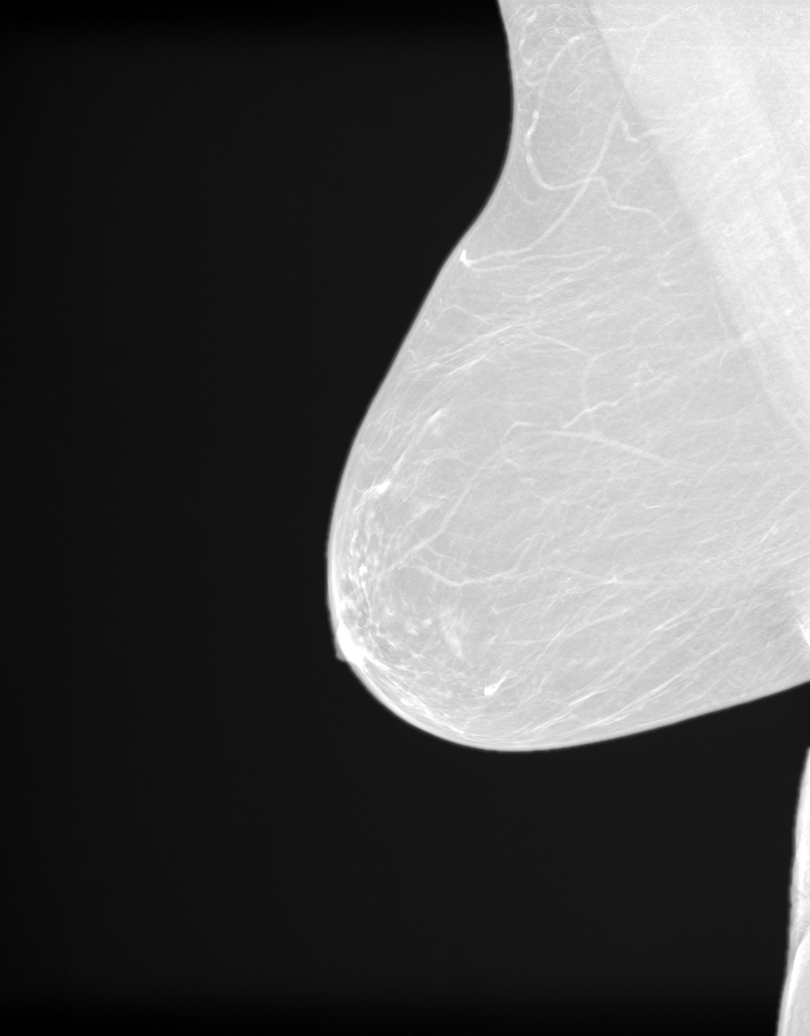

[L]
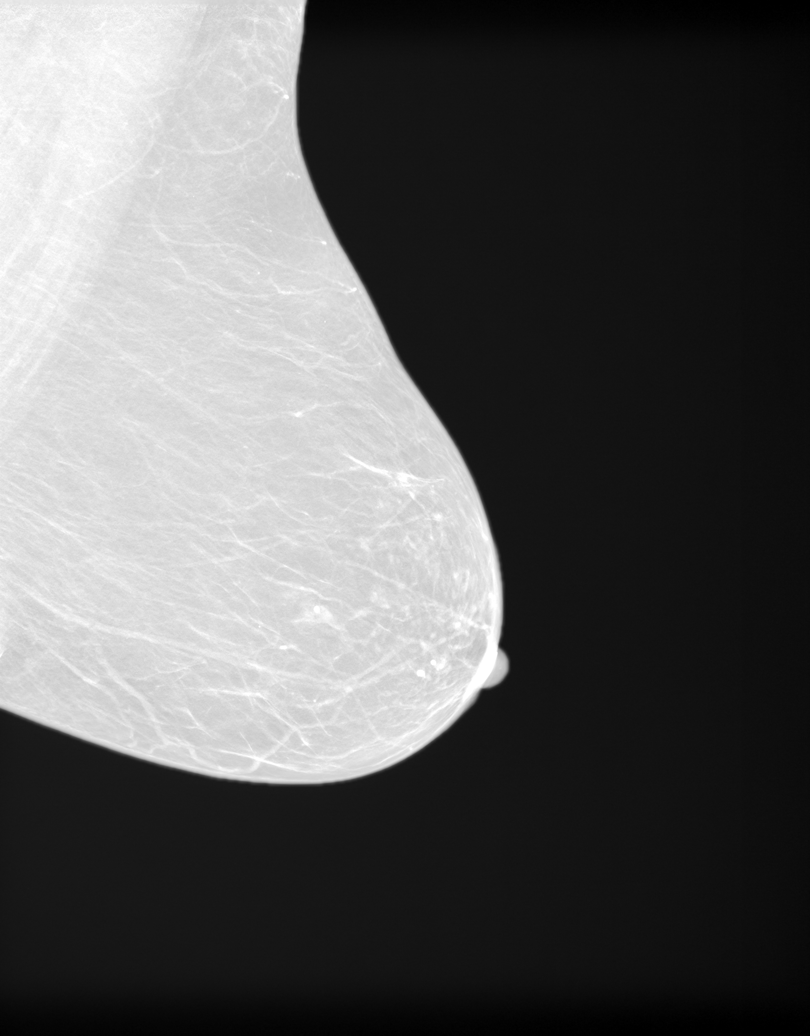

[7 of 24 positions shown; findings below may reference images not displayed]

FINDINGS: There are scattered fibroglandular elements.  There is no mass or suspicious cluster of microcalcifications.   There is no architectural distortion, skin thickening or nipple retraction.
IMPRESSION: 1.  BIRADS 2-Benign findings. Patient has been added in a reminder system with a target date for the next screening mammography.

2.  DENSITY CODE – B (Scattered areas of fibroglandular density). 

Final Assessment Code:

Bi-Rads 2 

BI-RADS 0
Need additional imaging evaluation.

BI-RADS 1
Negative mammogram.

BI-RADS 2
Benign finding.

BI-RADS 3
Probably benign finding; short-interval follow-up suggested.

BI-RADS 4
Suspicious abnormality; biopsy should be considered.

BI-RADS 5
Highly suggestive of malignancy; appropriate action should be taken.

BI-RADS 6
Known biopsy-proven malignancy; appropriate action should be taken. 

NOTE:
In compliance with Federal regulations, the results of this mammogram are being sent to the patient.

## 2020-10-19 IMAGING — CR XRAY KNEE 3 VIEWS RT
1 series · 1 of 1 positions shown · non-contrast
Comparison: None.

﻿EXAM:  XRAY KNEE 3 VIEWS LEFT

EXAM:  XRAY KNEE 3 VIEWS RIGHT
INDICATION: Chronic bilateral knee pain.

[view not recorded]
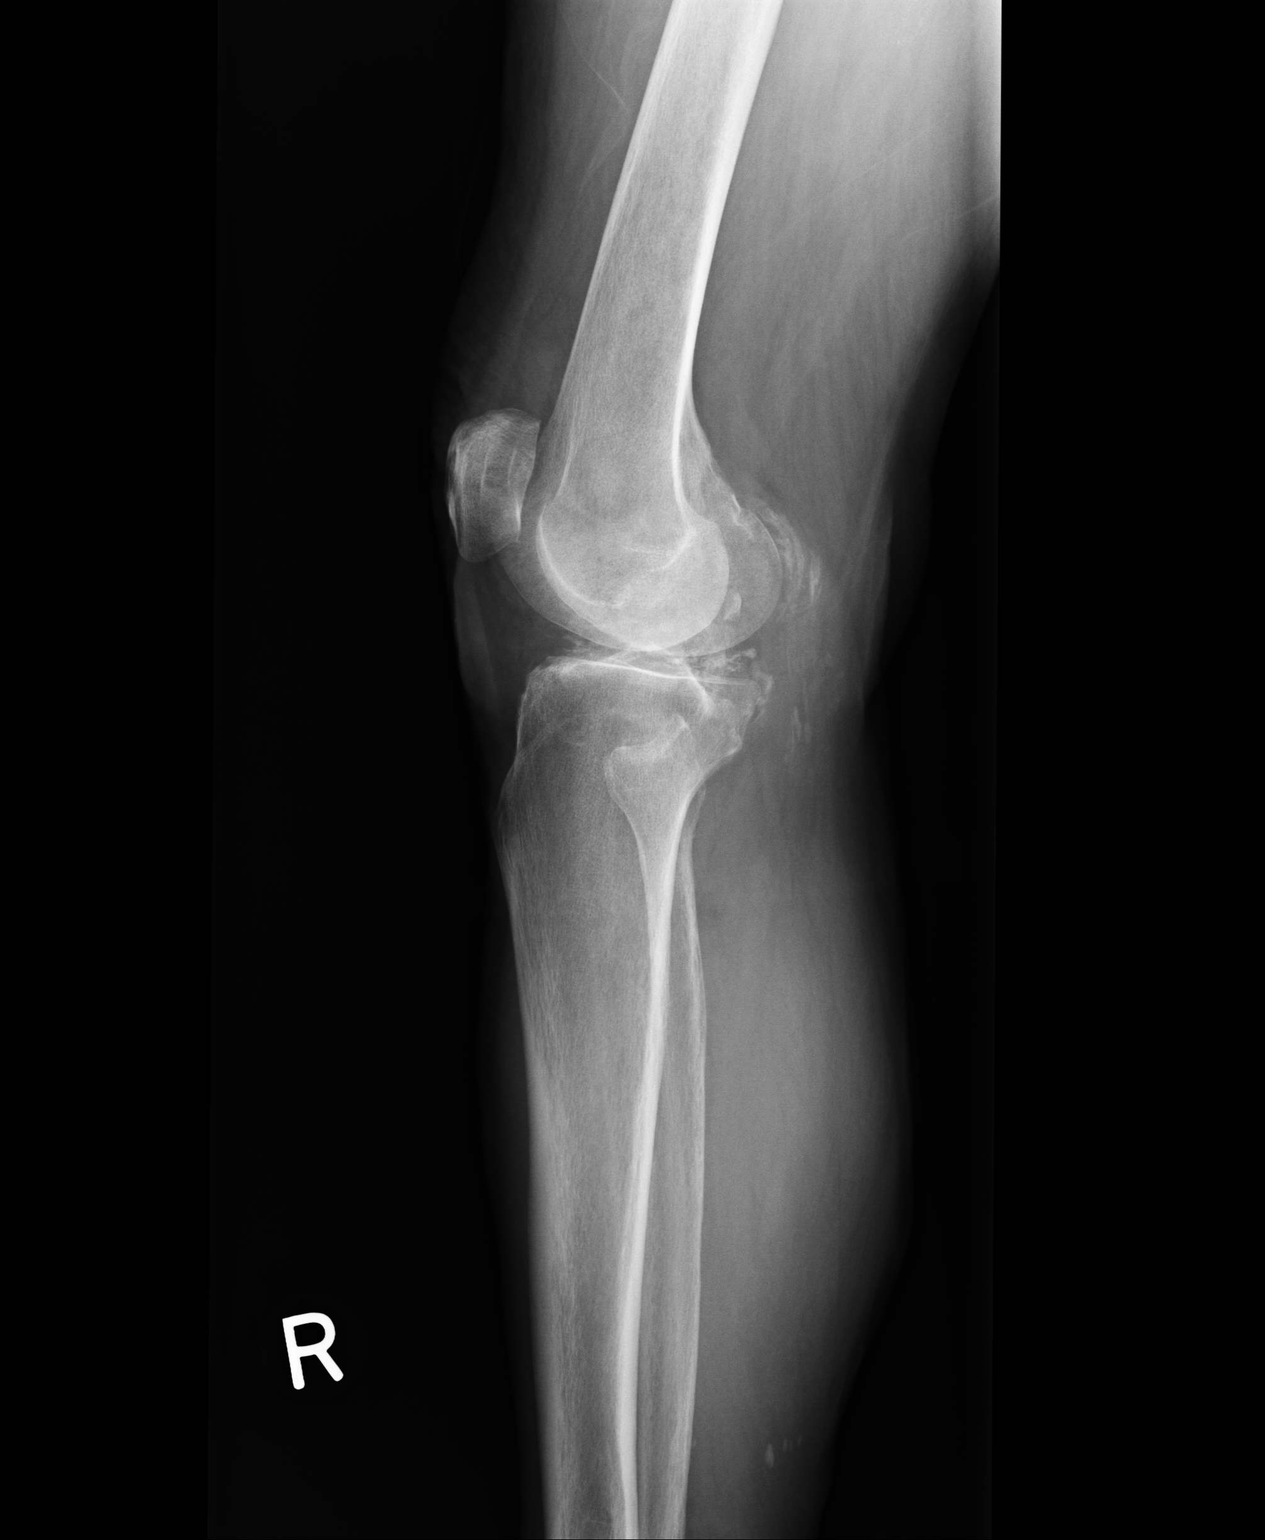

[1 of 1 positions shown; findings below may reference images not displayed]

FINDINGS: Bones are diffusely demineralized. There is no acute fracture or subluxation.  Joint spaces are fairly well maintained.  There is no suprapatellar effusion.  Faint meniscal calcifications are suggestive of chondrocalcinosis.  Posterior capsular calcifications are also noted bilaterally.
IMPRESSION: Chondrocalcinosis and capsular calcifications bilaterally.  No acute osseous abnormality.

## 2020-10-19 IMAGING — CR XRAY HAND MINIMUM 3 VIEWS LT
1 series · 3 of 3 positions shown · non-contrast
Comparison: None.

﻿EXAM:  [id number] - X-RAY EXAM OF HAND MINIMUM 3 VIEWS

25001 - X-RAY EXAM OF WRIST BILATERAL
INDICATION: Bilateral hand and wrist pain.

[Series 1: view not recorded · 0.17mm/px · 3 of 3 slices shown]
[im 1/3]
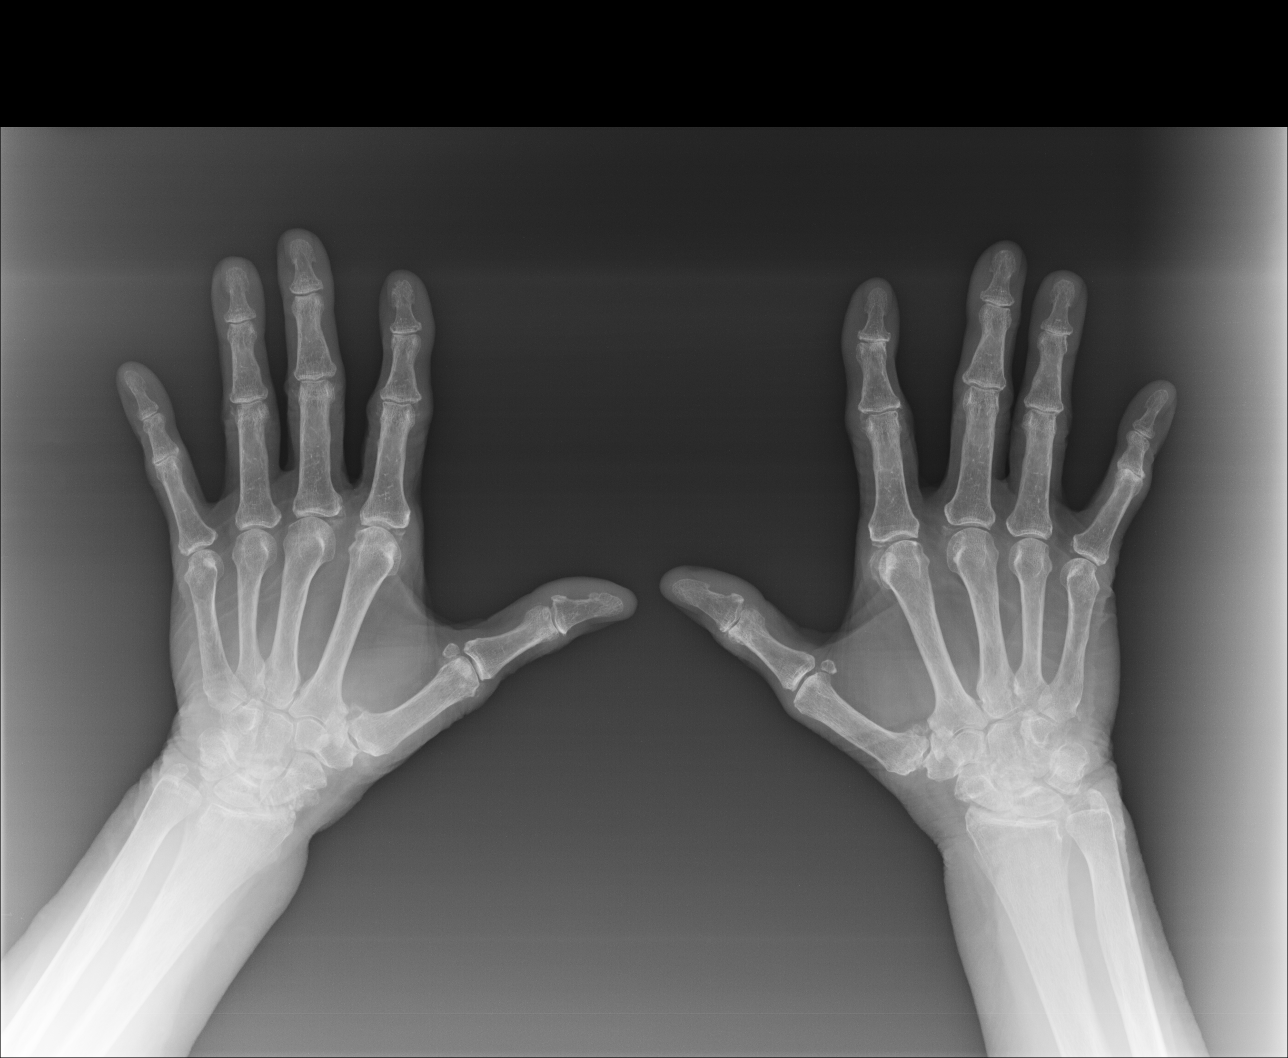
[im 2/3]
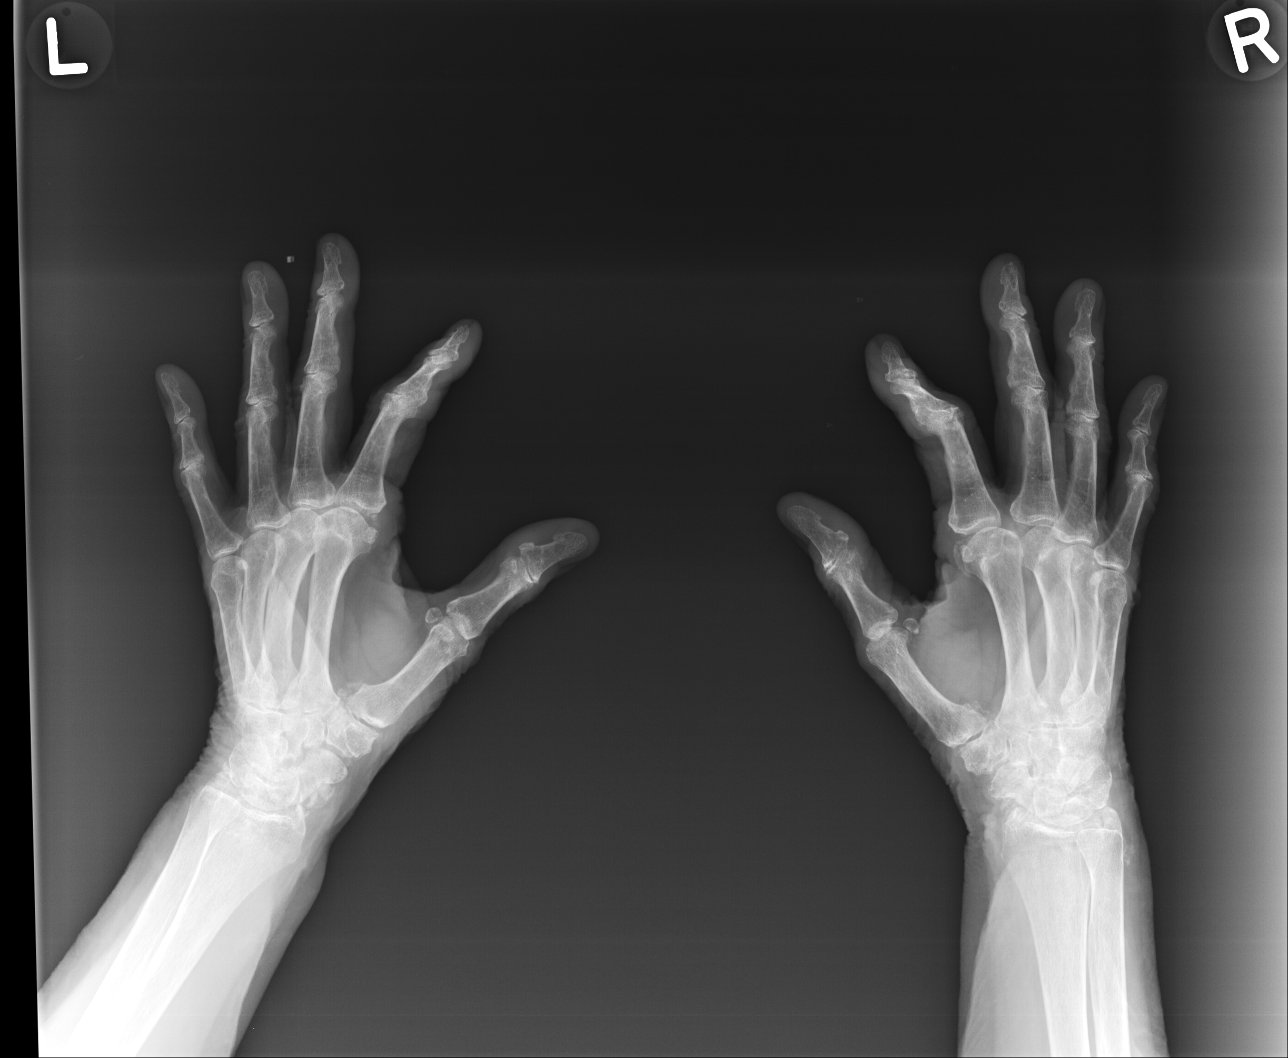
[im 3/3]
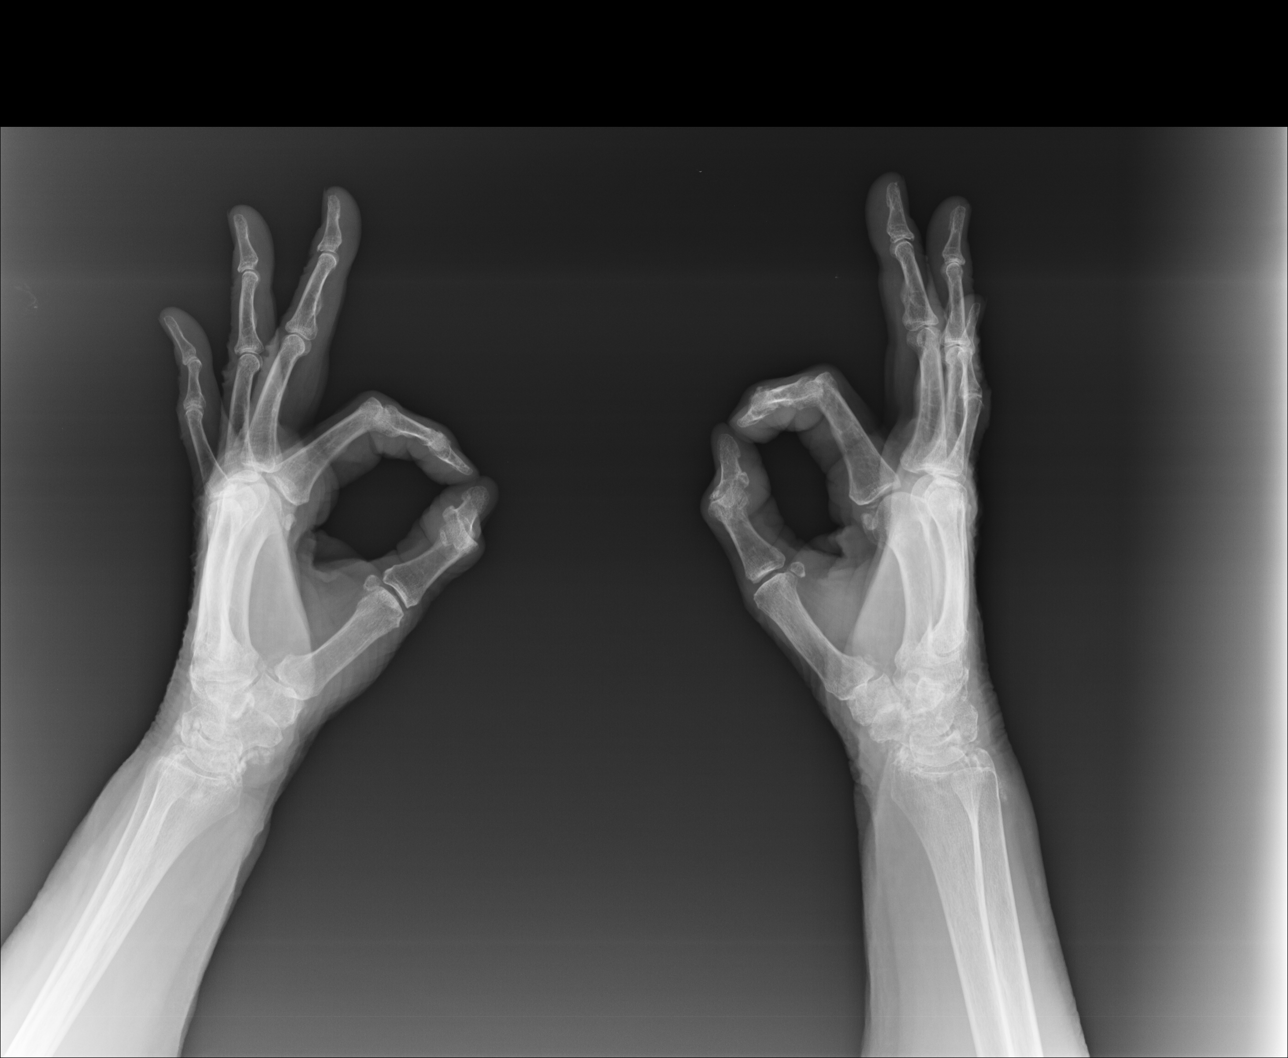

[3 of 3 positions shown; findings below may reference images not displayed]

FINDINGS: Bones are diffusely demineralized. There is no acute fracture or subluxation.  There is moderate osteoarthritis of the first carpometacarpal joints bilaterally.  Moderate osteoarthritis of the left radiocarpal joint is also identified.  There is moderate to severe osteoarthritis of the right second distal interphalangeal and fifth proximal interphalangeal joints.  Faint capsular calcifications are seen within the second and third metacarpophalangeal joints bilaterally.  Surrounding soft tissues are unremarkable.
IMPRESSION: Osteoarthritis as detailed above, no acute osseous abnormality.

## 2020-10-19 IMAGING — CR XRAY KNEE 3 VIEWS LT
1 series · 2 of 2 positions shown · non-contrast
Comparison: None.

﻿EXAM:  XRAY KNEE 3 VIEWS LEFT

EXAM:  XRAY KNEE 3 VIEWS RIGHT
INDICATION: Chronic bilateral knee pain.

[Series 1: view not recorded · 0.17mm/px · 2 of 2 slices shown]
[im 1/2]
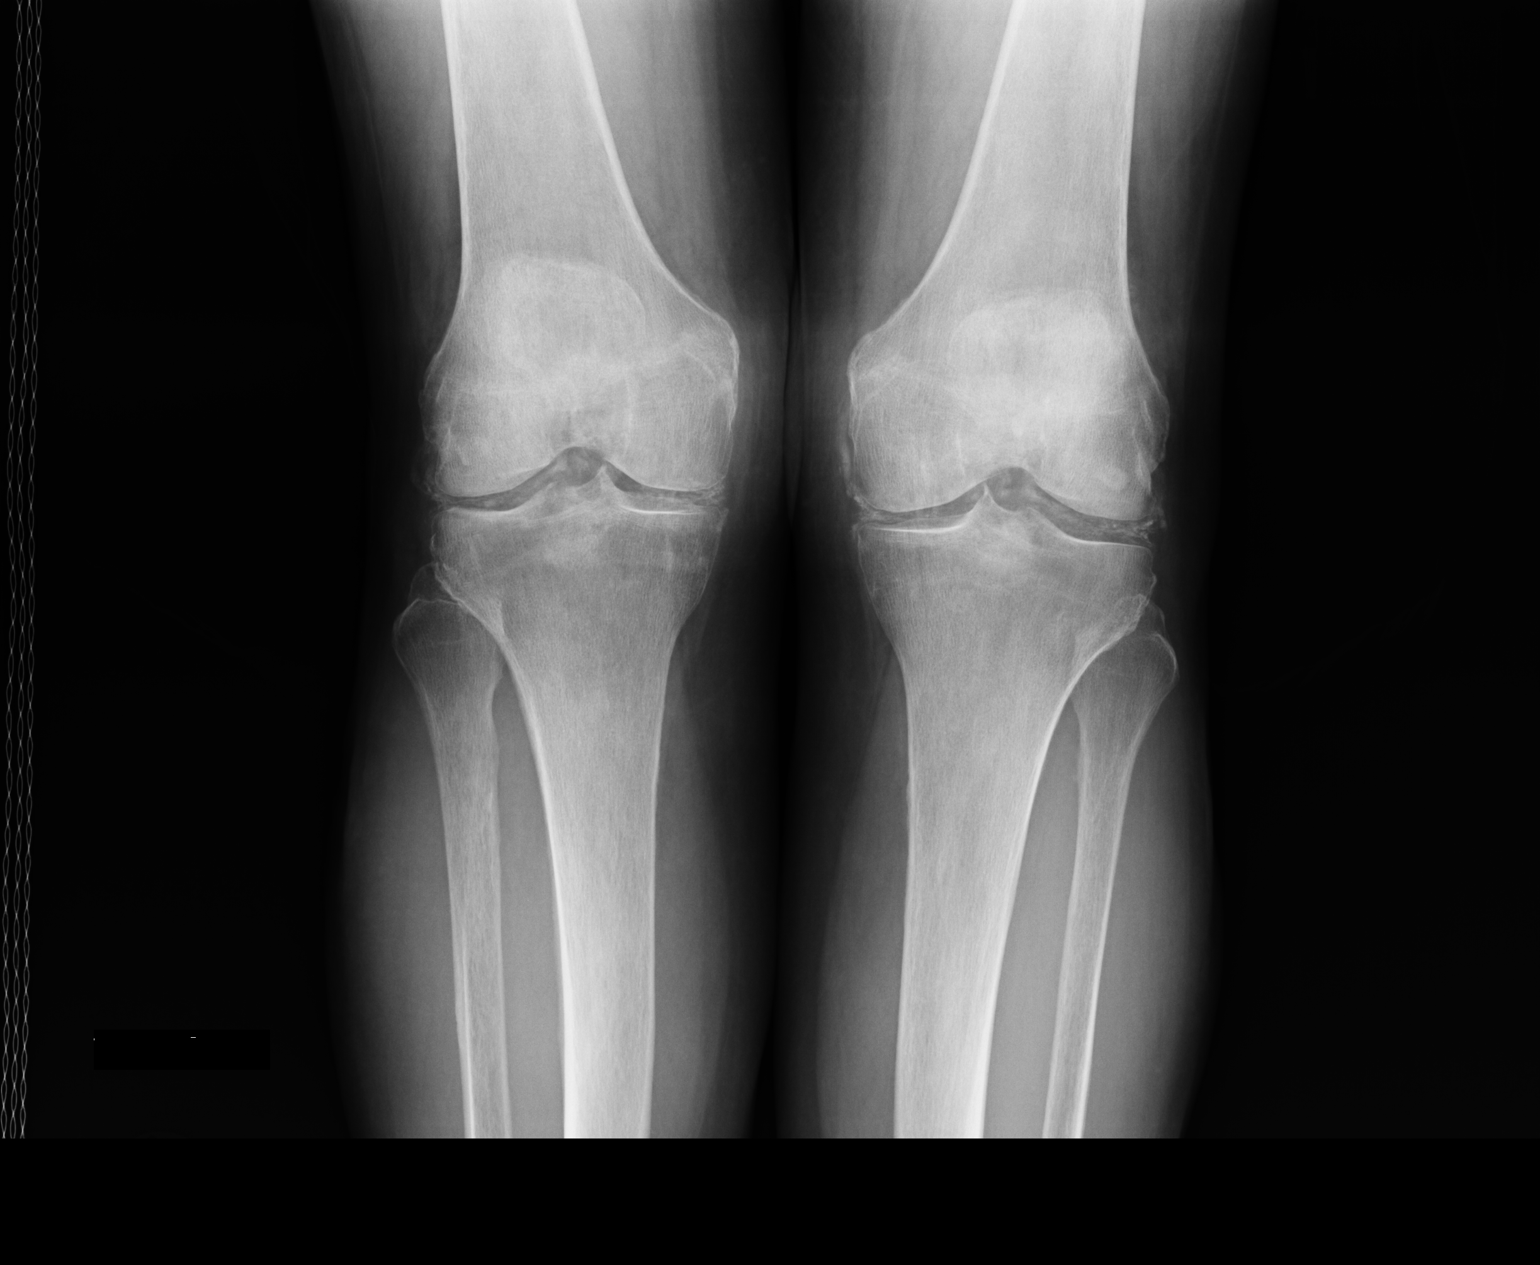
[im 2/2]
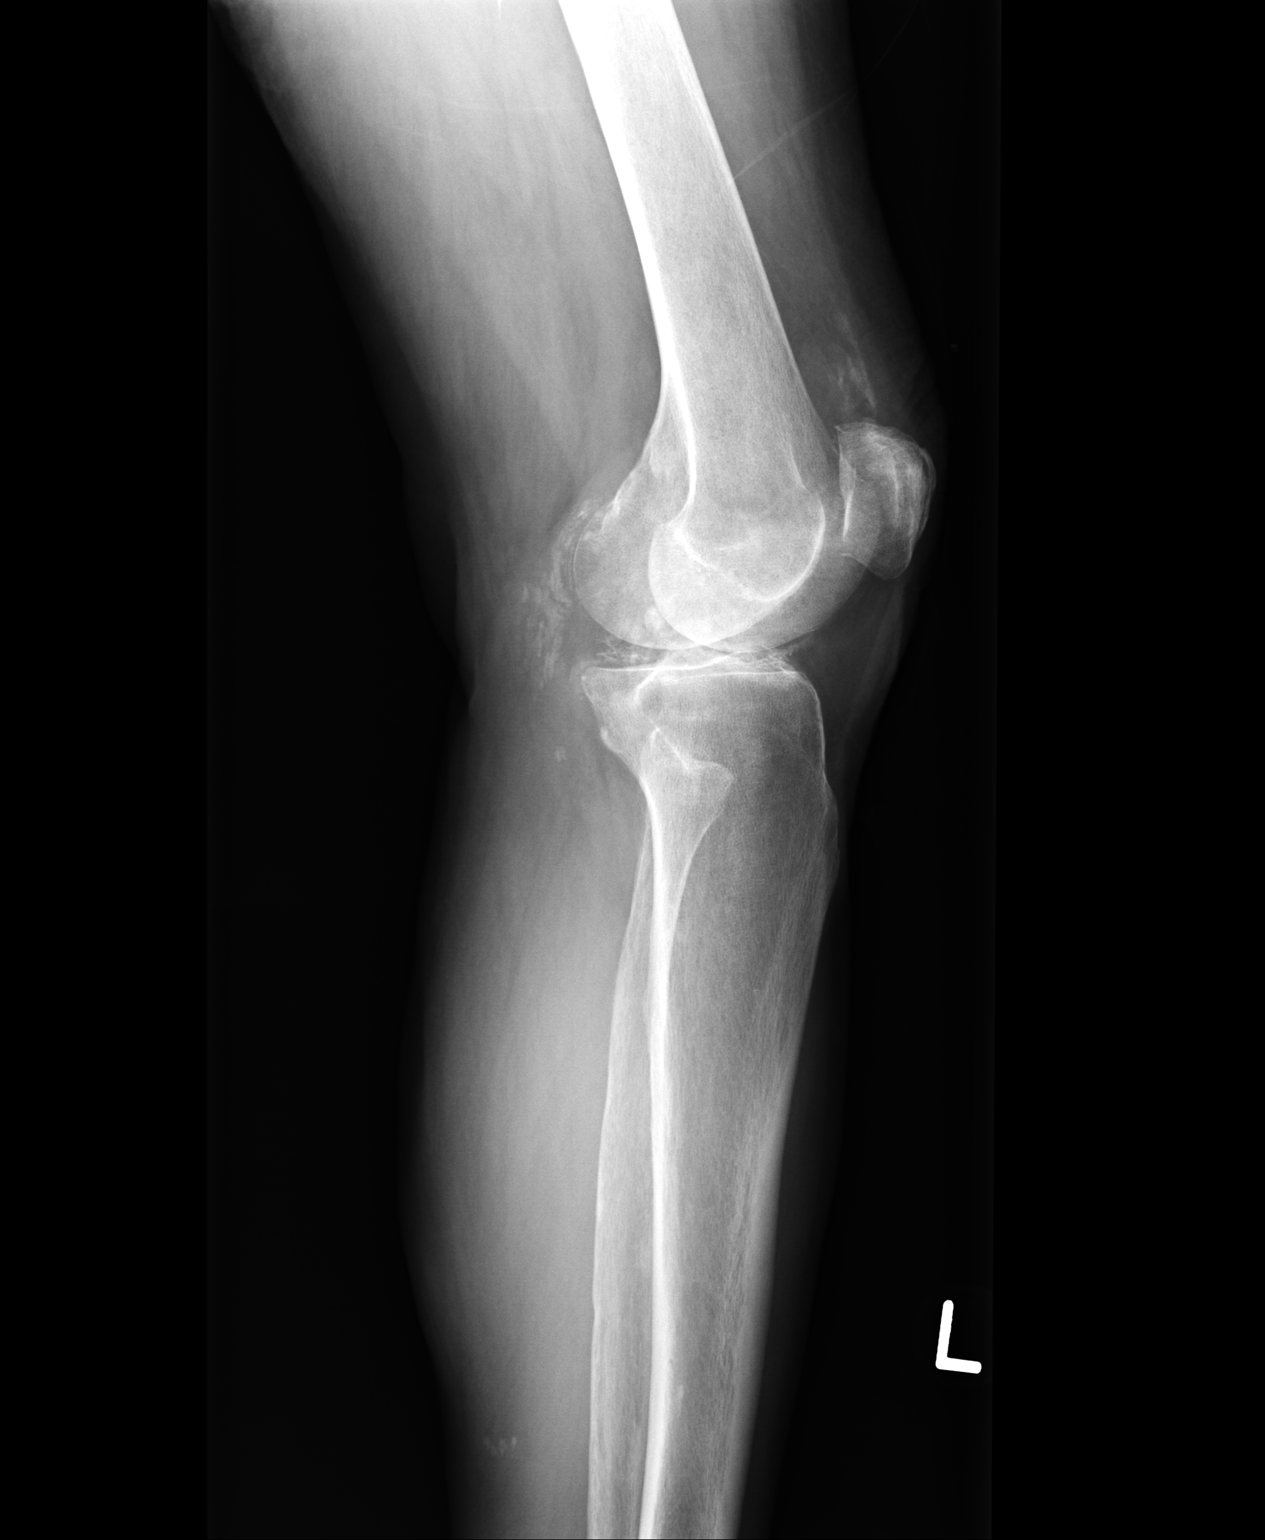

[2 of 2 positions shown; findings below may reference images not displayed]

FINDINGS: Bones are diffusely demineralized. There is no acute fracture or subluxation.  Joint spaces are fairly well maintained.  There is no suprapatellar effusion.  Faint meniscal calcifications are suggestive of chondrocalcinosis.  Posterior capsular calcifications are also noted bilaterally.
IMPRESSION: Chondrocalcinosis and capsular calcifications bilaterally.  No acute osseous abnormality.

## 2020-10-19 IMAGING — CR XRAY HIP W/PELVIS BIL 2 VIEWS
1 series · 3 of 3 positions shown · non-contrast
Comparison: None.

﻿EXAM:  XRAY HIP W/PELVIS BIL 2 VIEWS
INDICATION: Bilateral hip pain.

[Series 1: view not recorded · 0.17mm/px · 3 of 3 slices shown]
[im 1/3]
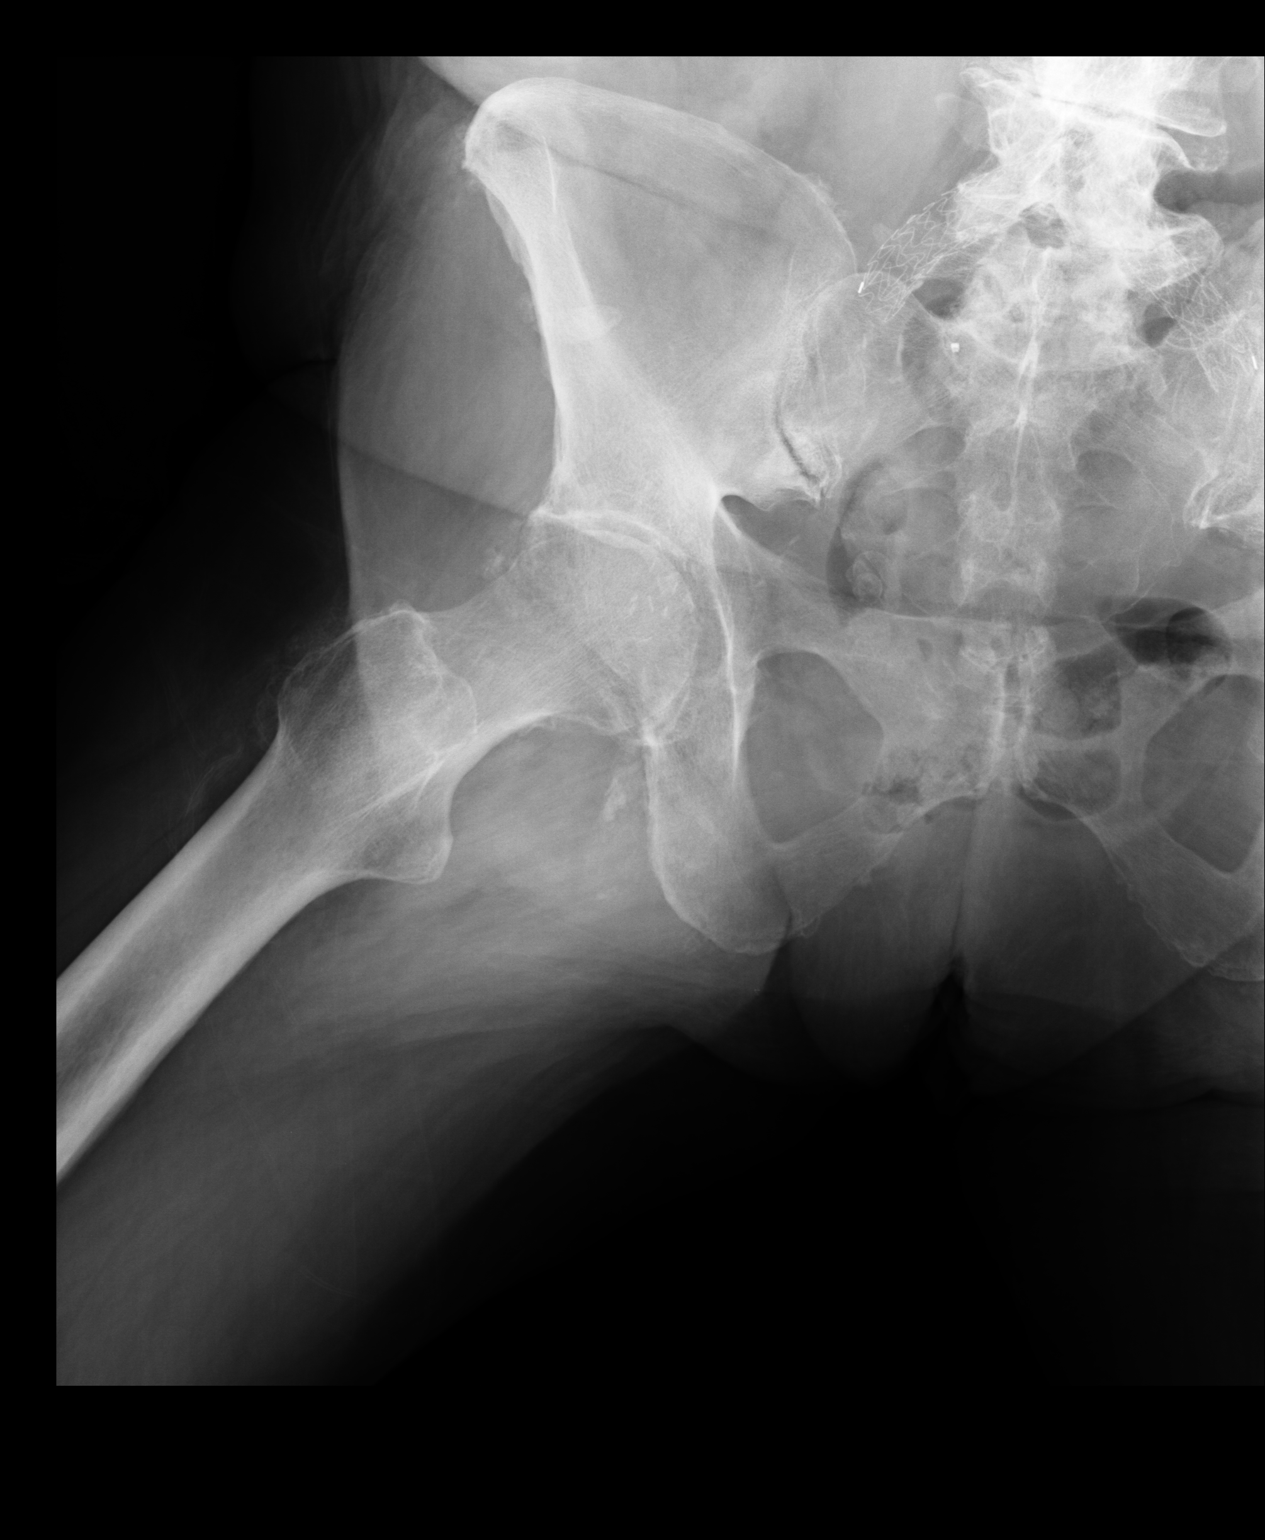
[im 2/3]
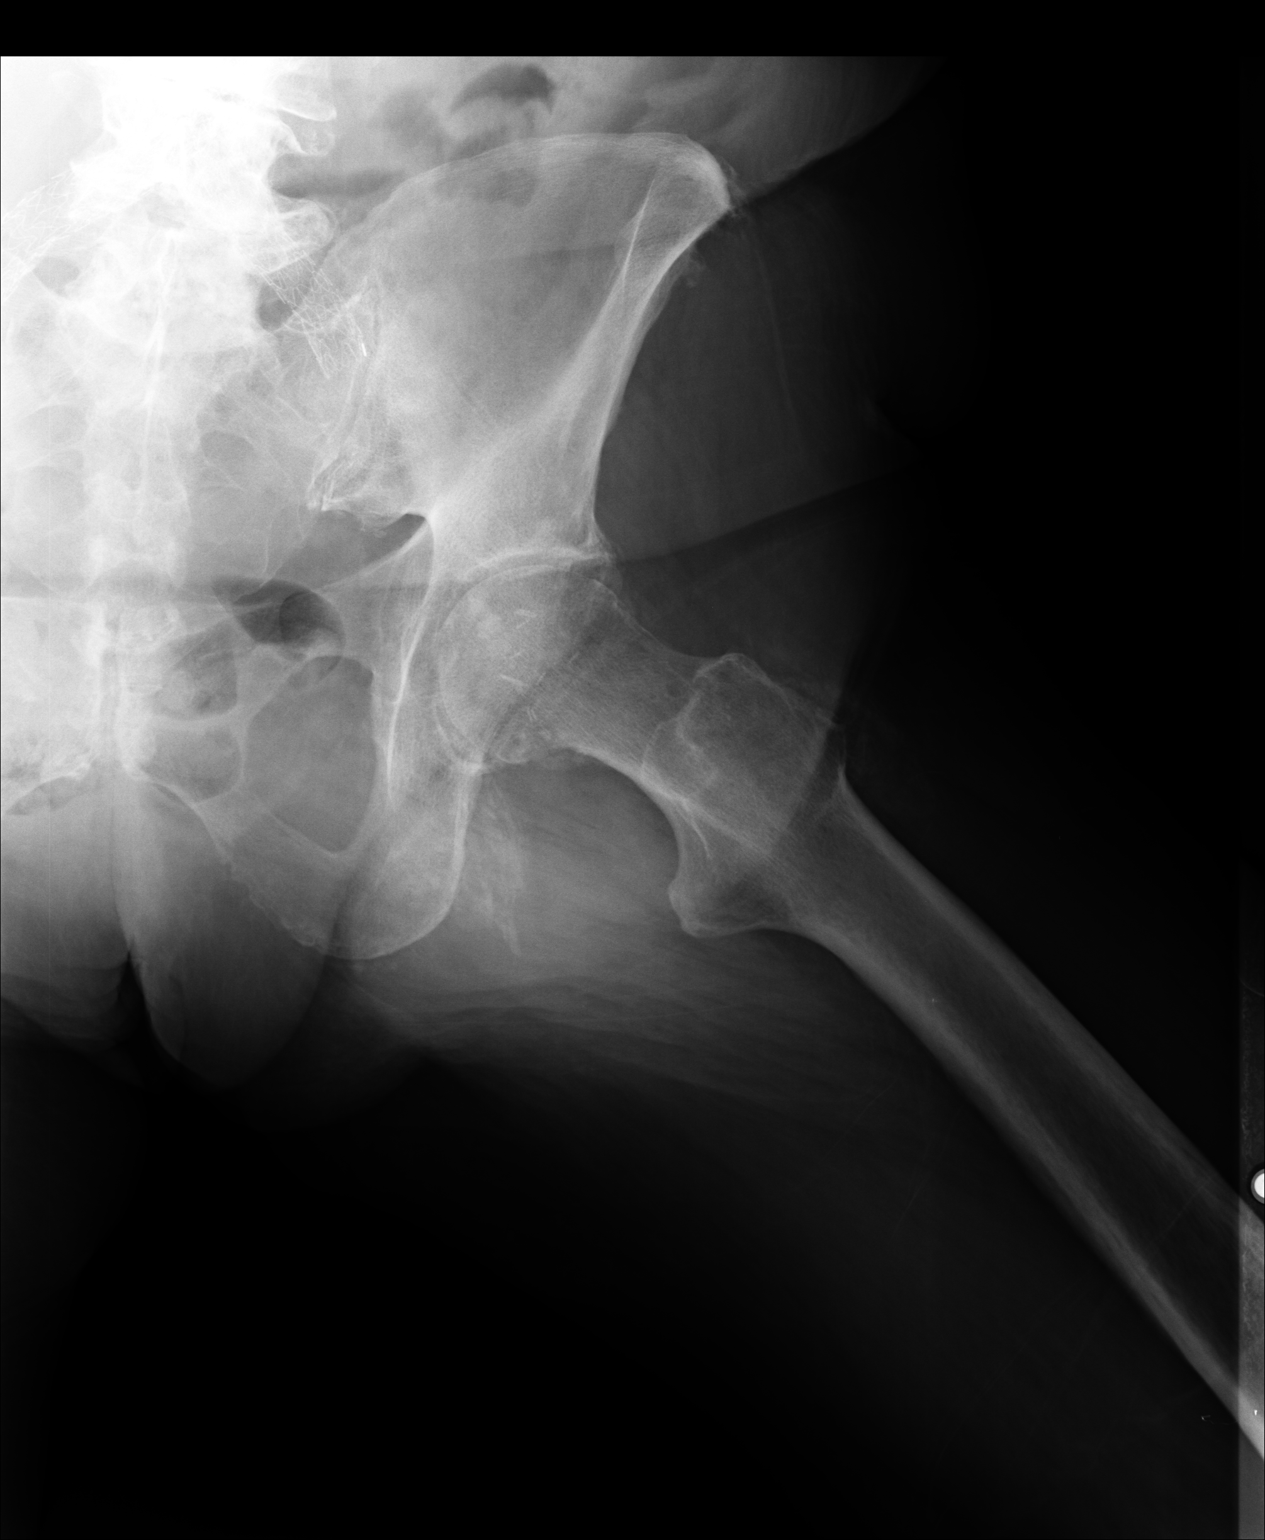
[im 3/3]
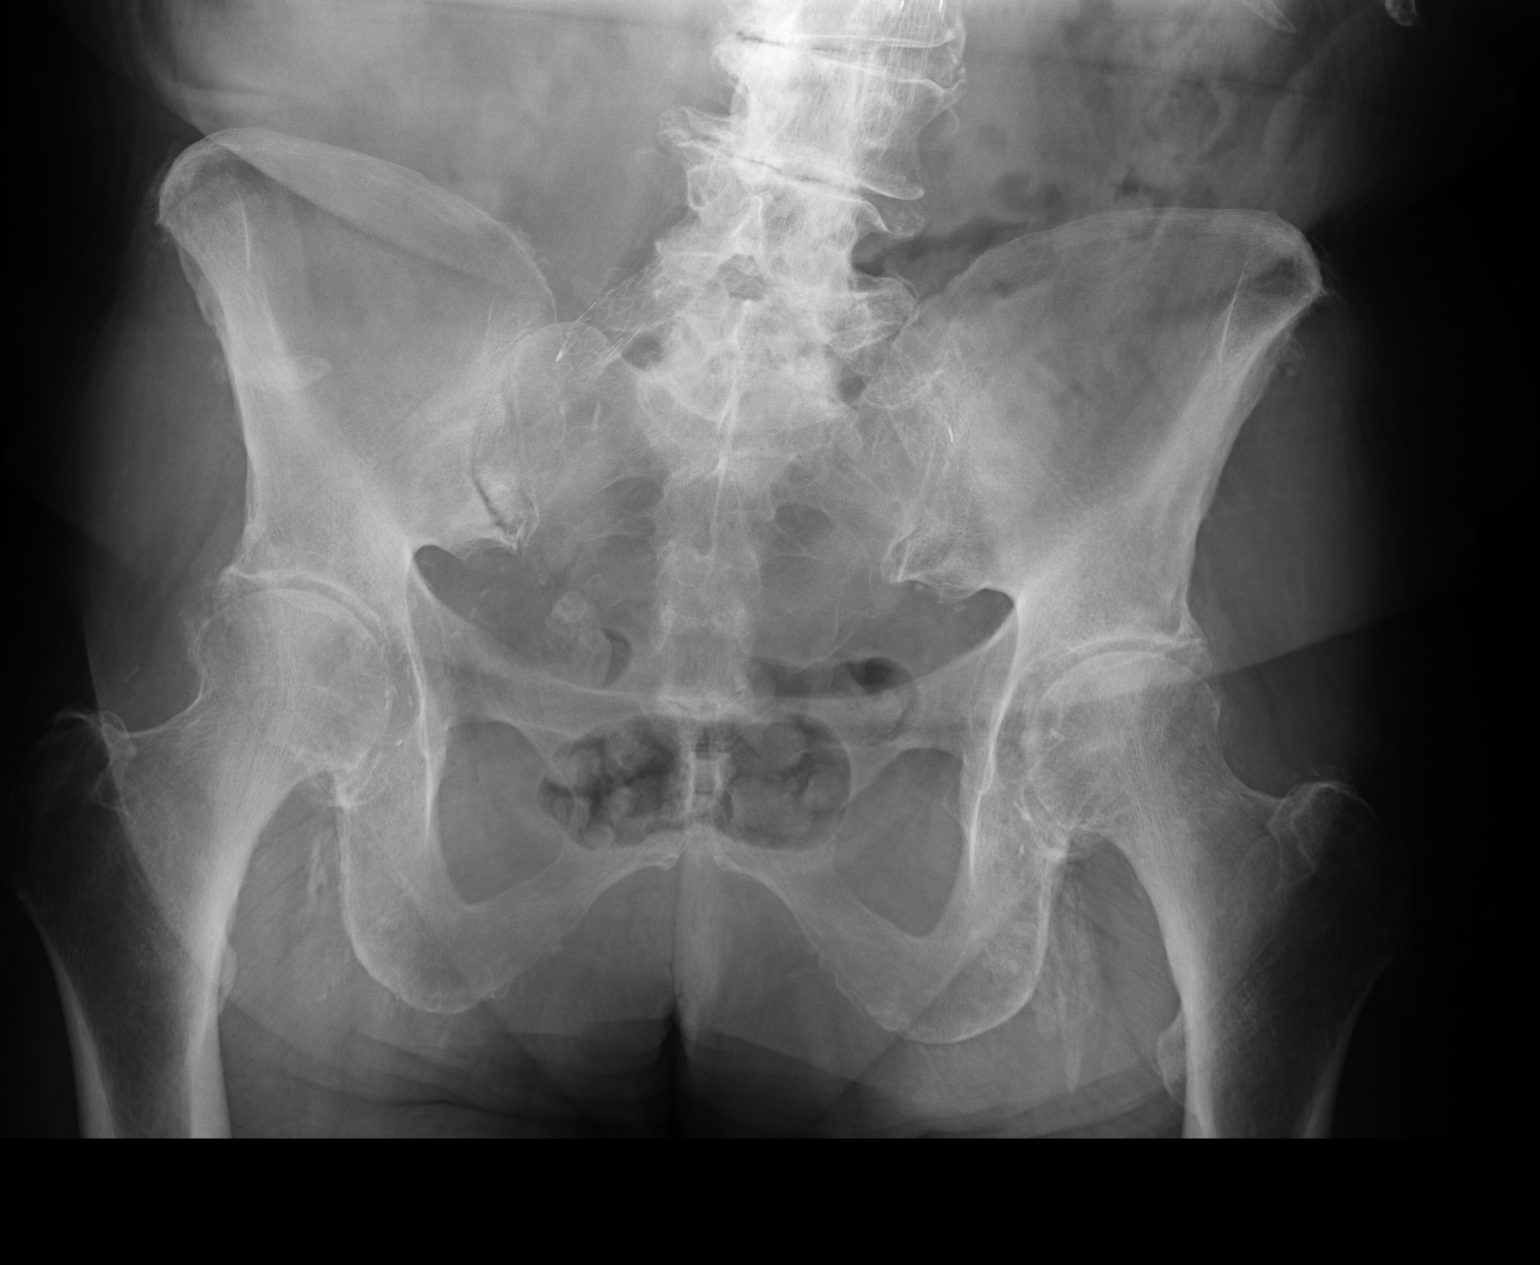

[3 of 3 positions shown; findings below may reference images not displayed]

FINDINGS: There is no acute fracture or subluxation.  Hip joints are well maintained.  There are significant capsular calcifications bilaterally.  Surrounding soft tissues are unremarkable.
IMPRESSION: Significant bilateral capsular calcifications.  No acute osseous abnormality.

## 2020-10-19 IMAGING — CR XRAY SHOULDER MINIMUM 2 VIEW LT
1 series · 2 of 2 positions shown · non-contrast
Comparison: None.

﻿EXAM:  XRAY SHOULDER MINIMUM 2 VIEW LT
INDICATION: Left shoulder pain.

[Series 1: view not recorded · 0.17mm/px · 2 of 2 slices shown]
[im 1/2]
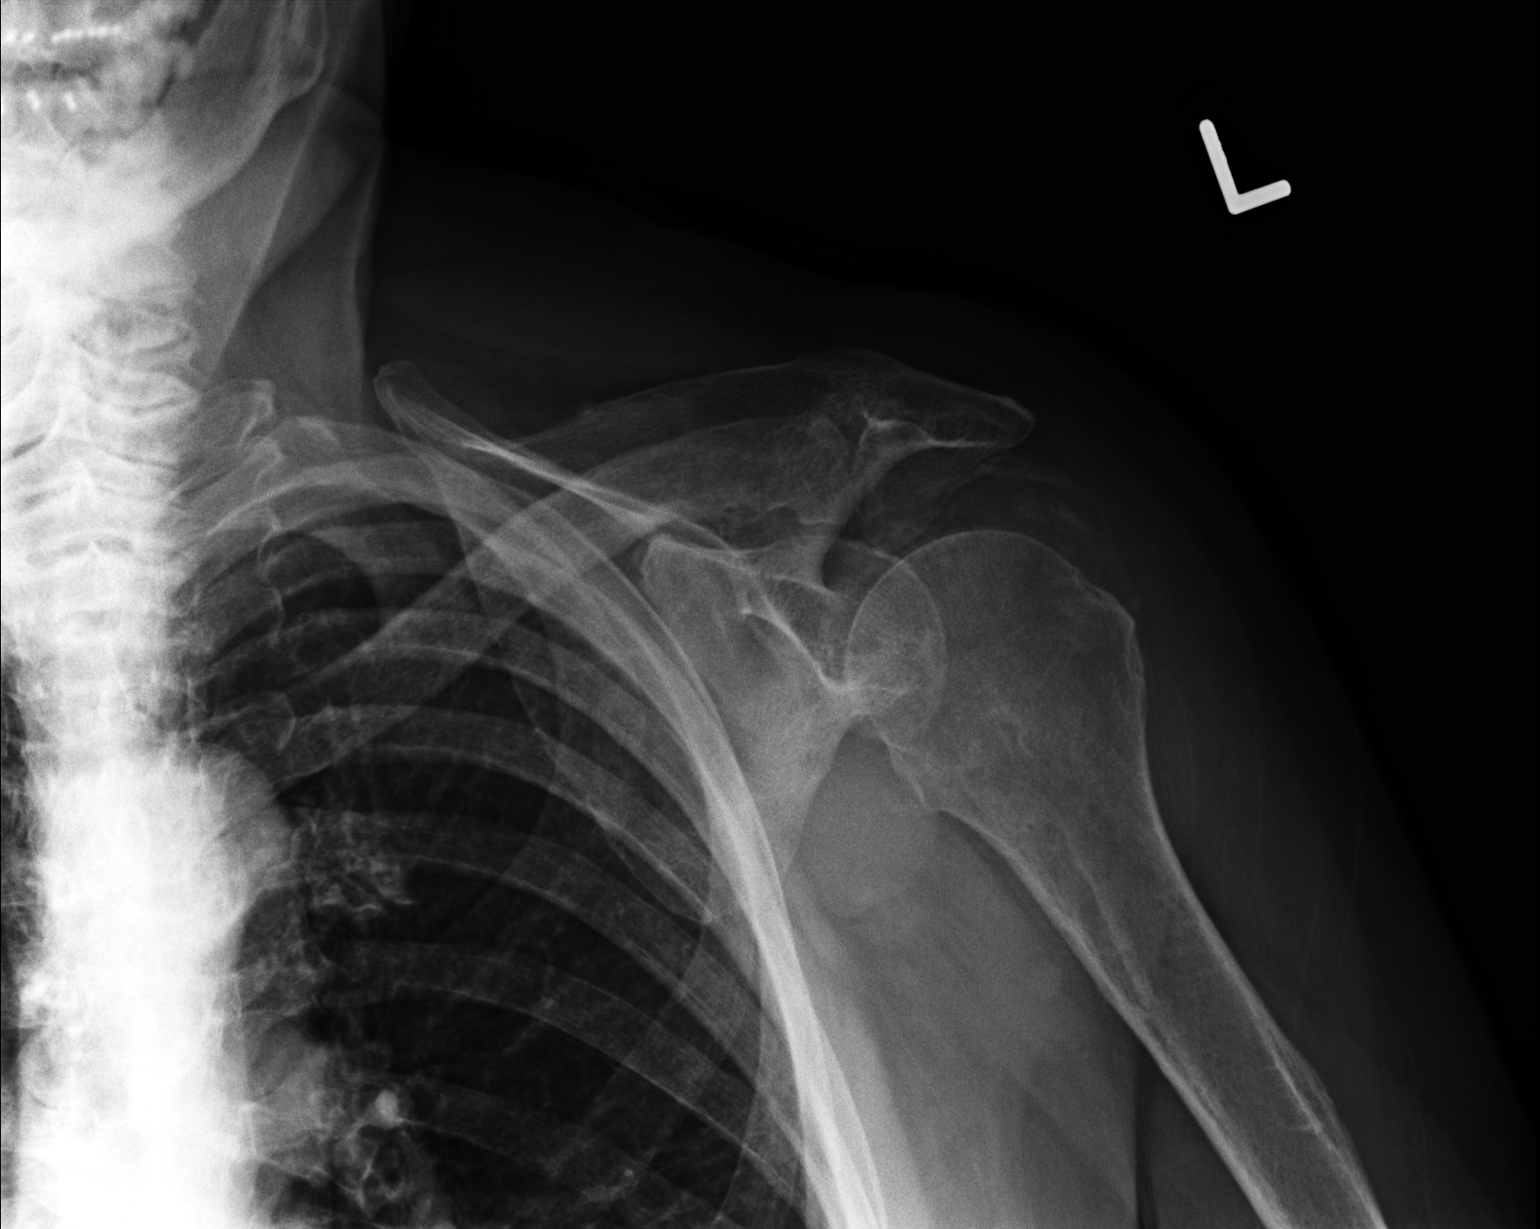
[im 2/2]
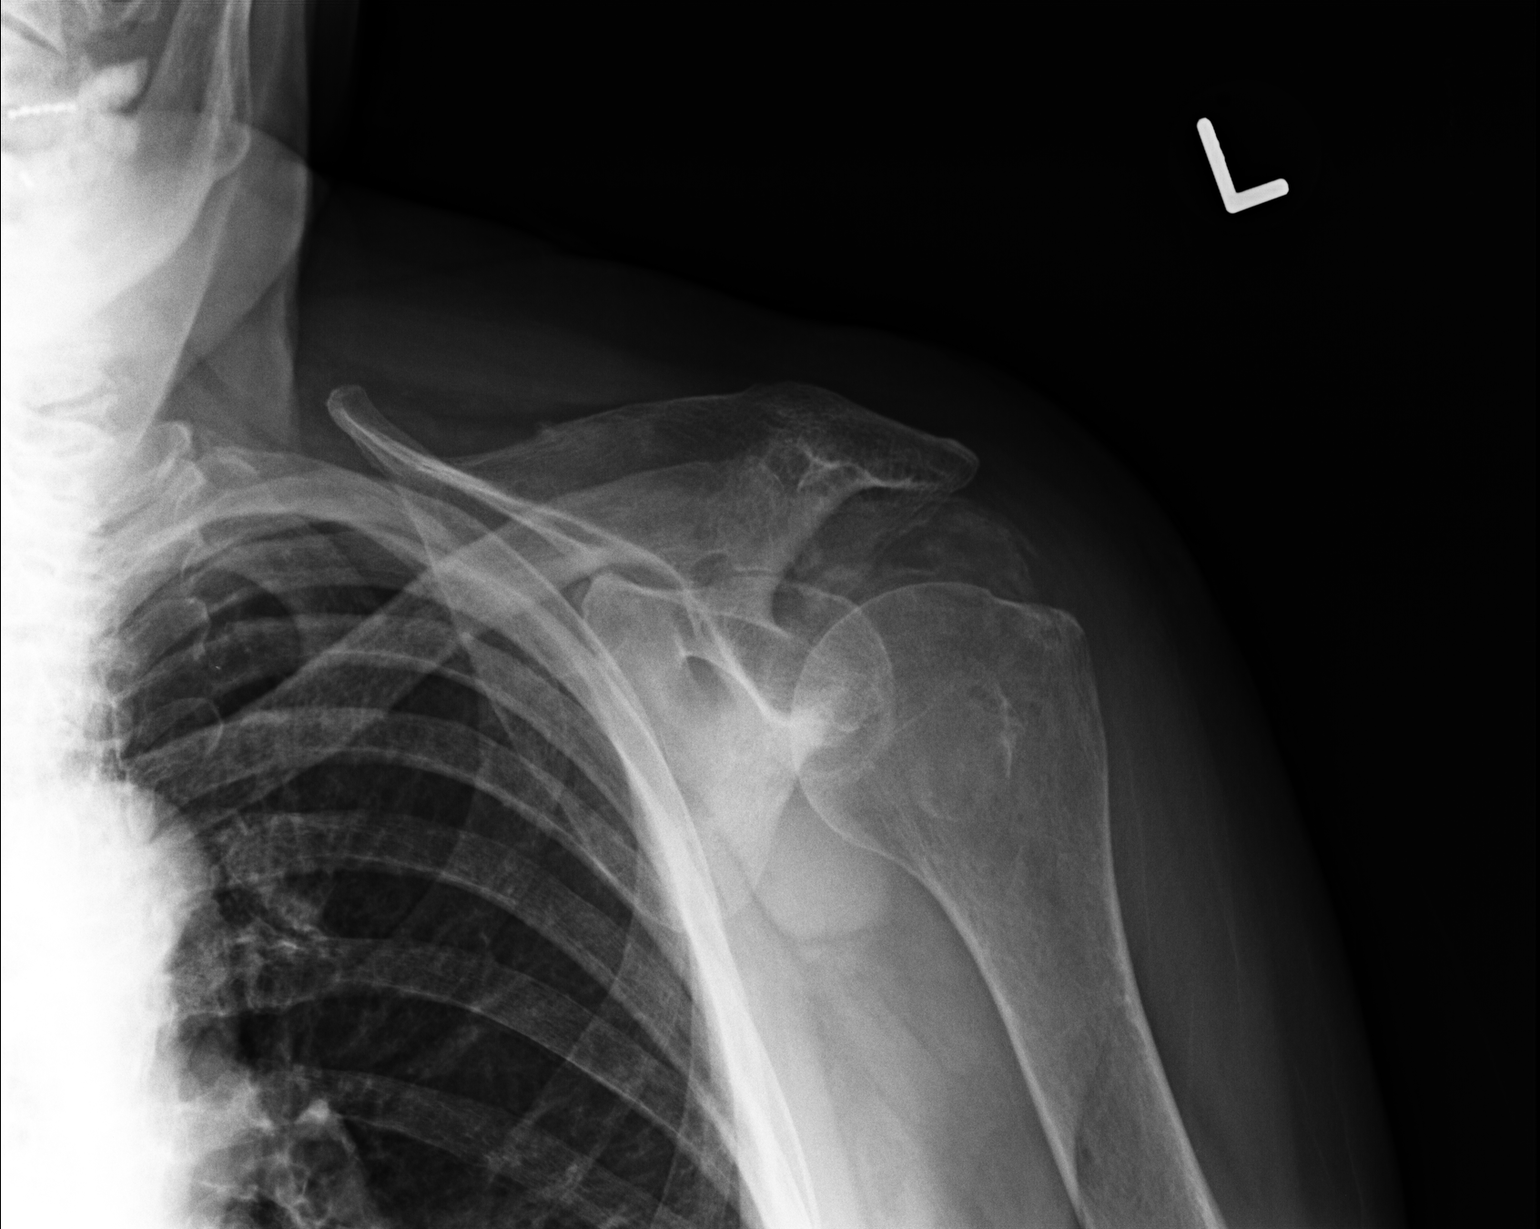

[2 of 2 positions shown; findings below may reference images not displayed]

FINDINGS: Bones are diffusely demineralized.  There is no acute fracture or subluxation.  Glenohumeral articulation is well maintained.  There is mild acromioclavicular joint osteoarthritis.  There are diffuse capsular calcifications.  Visualized left lung is clear.
IMPRESSION: Diffuse capsular calcifications and mild acromioclavicular joint osteoarthritis.

## 2020-10-19 IMAGING — CR XRAY ELBOW MINIMUM 3 VIEWS LT
1 series · 3 of 3 positions shown · non-contrast
Comparison: None.

﻿EXAM:  [id number] - X-RAY EXAM OF ELBOW LEFT
INDICATION: Left elbow pain, history of prior surgery.

[Series 1: view not recorded · 0.17mm/px · 3 of 3 slices shown]
[im 1/3]
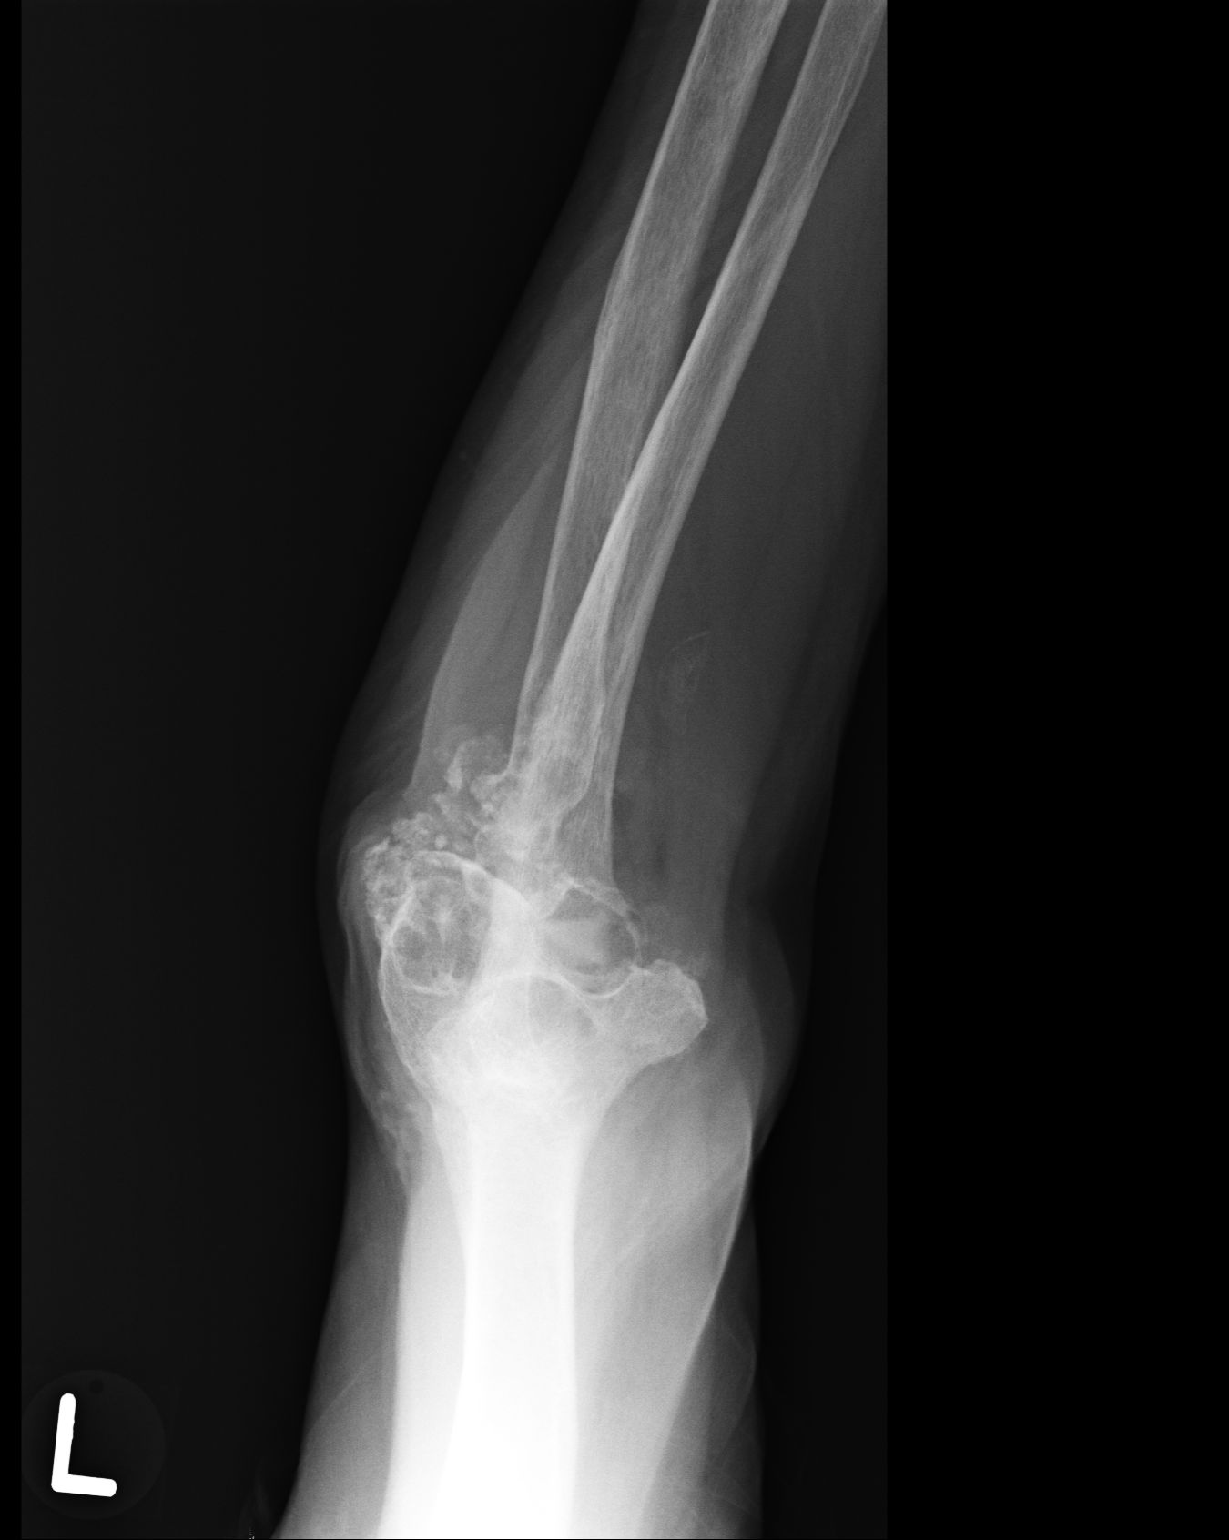
[im 2/3]
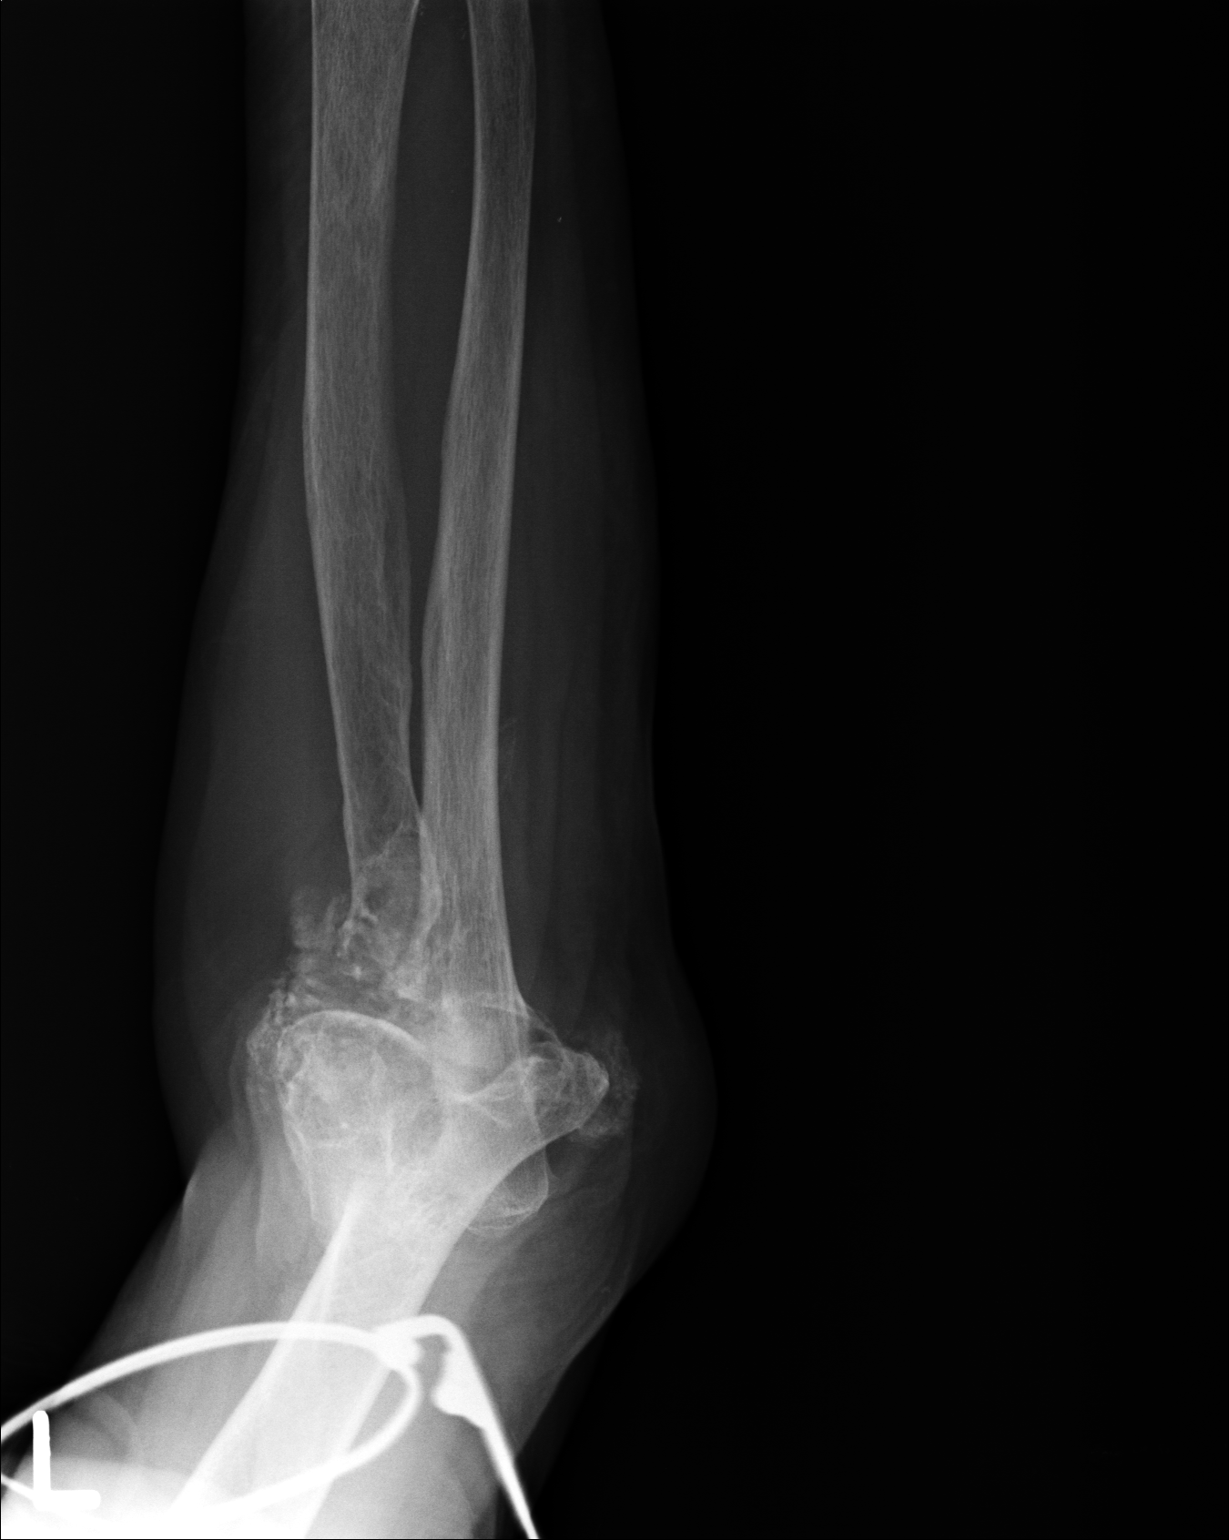
[im 3/3]
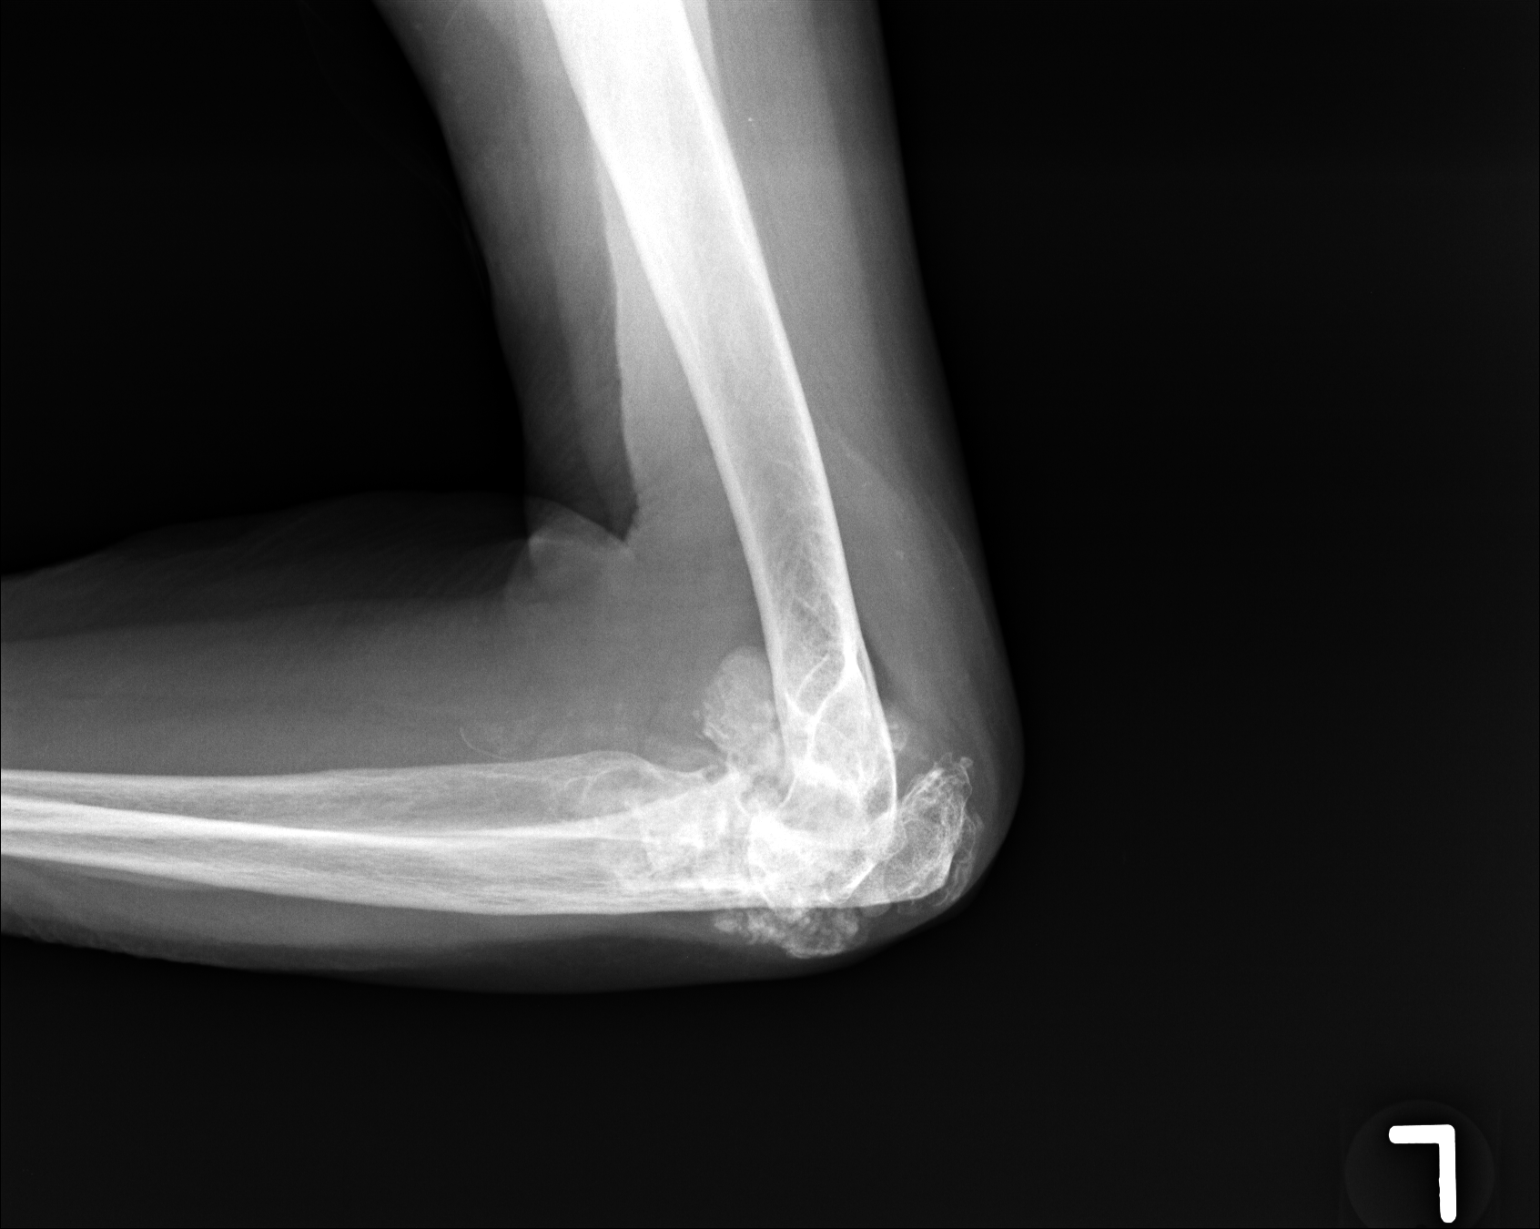

[3 of 3 positions shown; findings below may reference images not displayed]

FINDINGS: Bones are diffusely demineralized.  There is no acute fracture or subluxation.  There appears to be surgical resection of the radial head.  There is advanced osteoarthritis of the elbow joint.  No definite joint effusion is seen.  Surrounding soft tissues are unremarkable.
IMPRESSION: Suggestion of surgical resection of the radial head and advanced osteoarthritis of the elbow.  No acute osseous abnormality.

## 2021-04-11 IMAGING — MG 3D SCREENING MAMMO BIL W/CAD & TOMO
5 series · 7 of 24 positions shown · non-contrast
Comparison: Mammograms dated 04/03/2021, 01/18/2020.

------------- REPORT [id number] -------------
﻿

EXAM:  BILATERAL DIGITAL SCREENING MAMMOGRAM WITH 3D TOMOSYNTHESIS AND CAD
INDICATION: Asymptomatic 80-year-old with family history of breast cancer in her sister.  Lifetime breast cancer risk 2.8%.

[R]
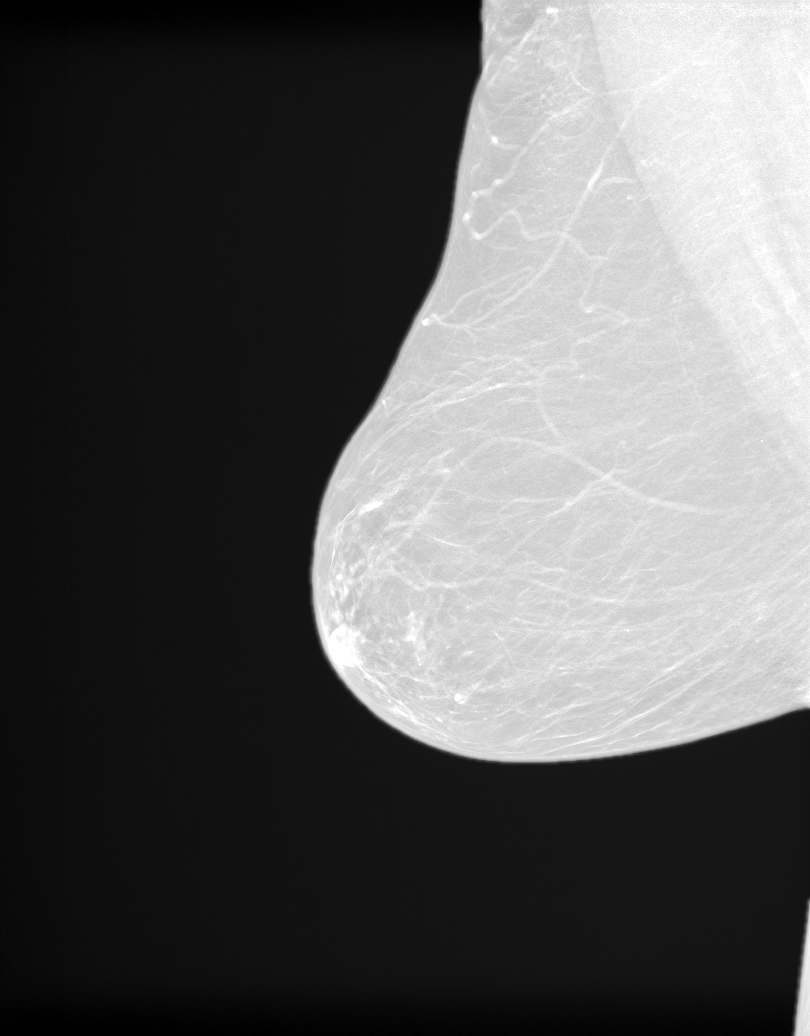

[L]
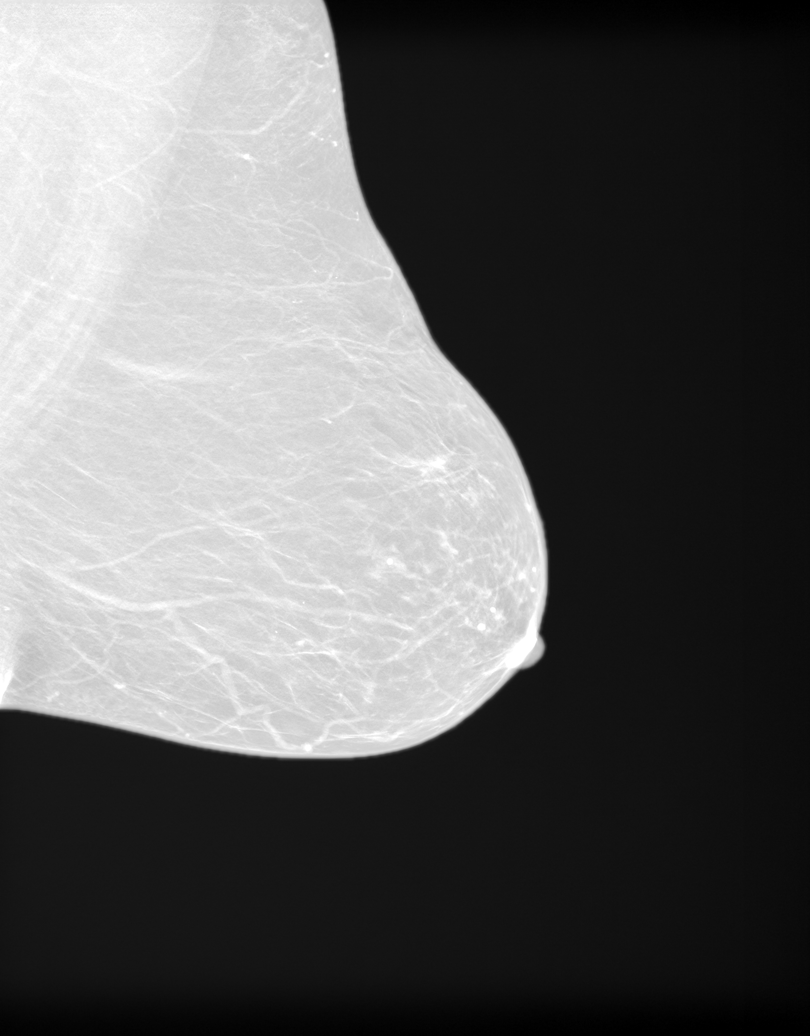

[R CC tomo · right · 0.10mm/px · 2 of 2 slices shown]
[im 1/2]
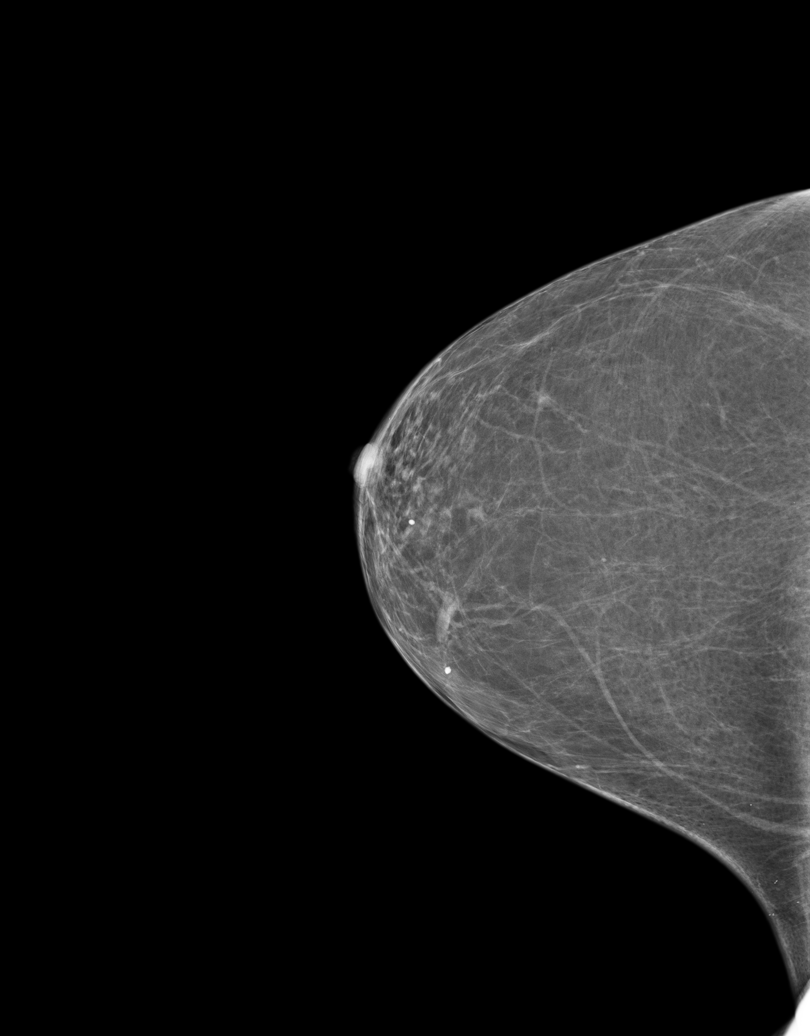
[im 2/2]
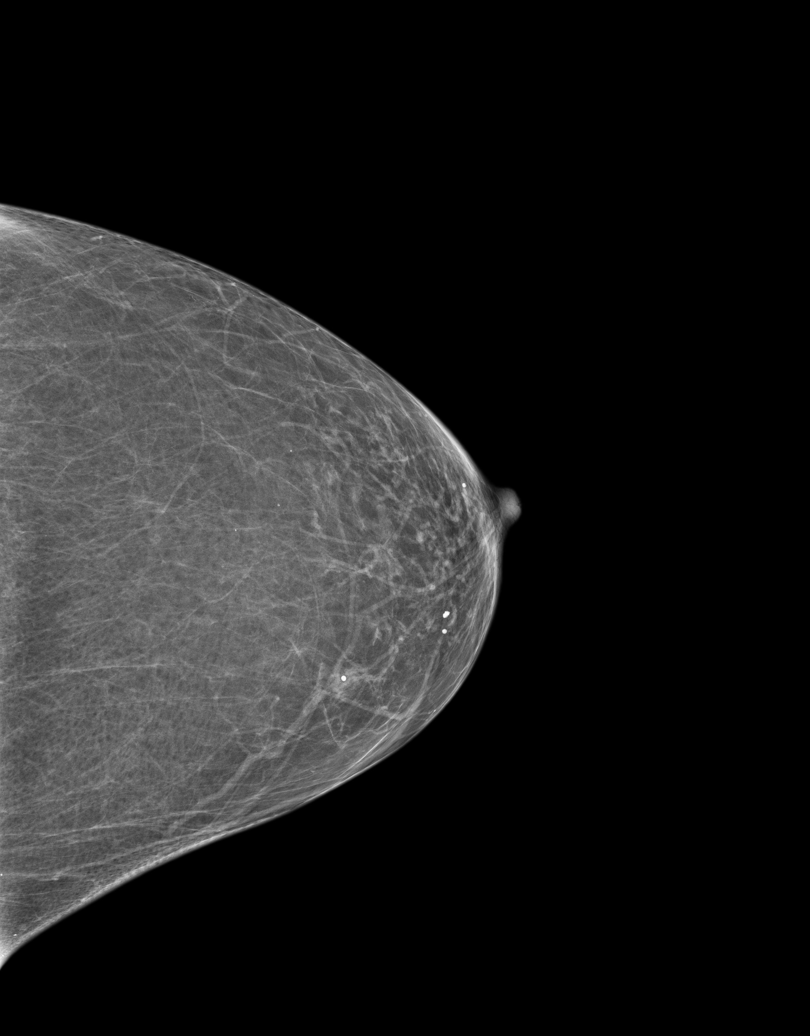

[3D SCREENING MAMMO BIL W/CAD & TOMO tomo · 2 acquisitions, 2 frames shown (1 of 2)]
[im 1/2]
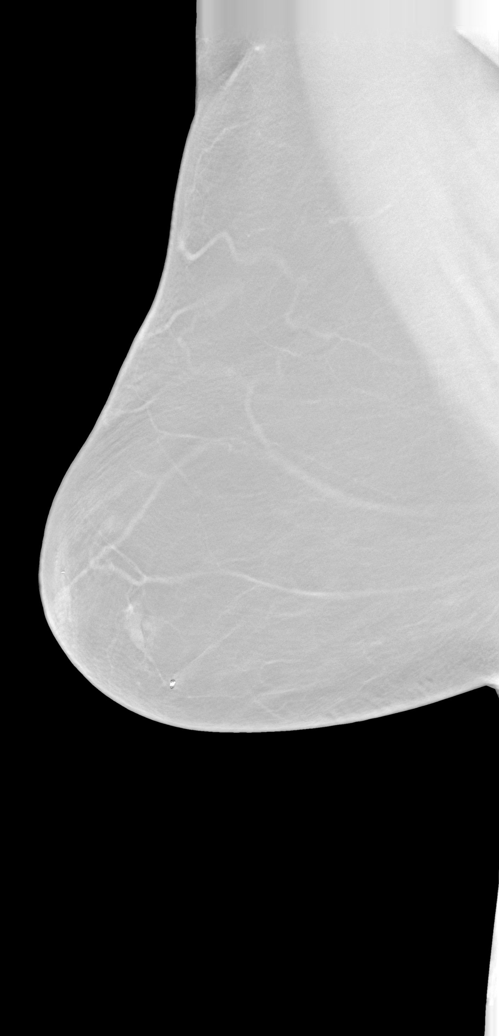
[im 2/2]
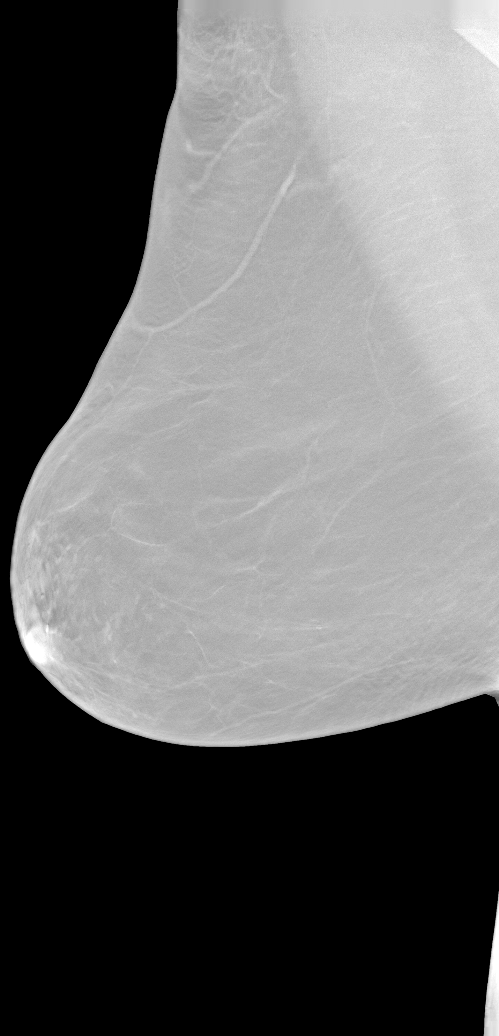

[3D SCREENING MAMMO BIL W/CAD & TOMO tomo (2 of 2) · tomo slice 9/56.0]
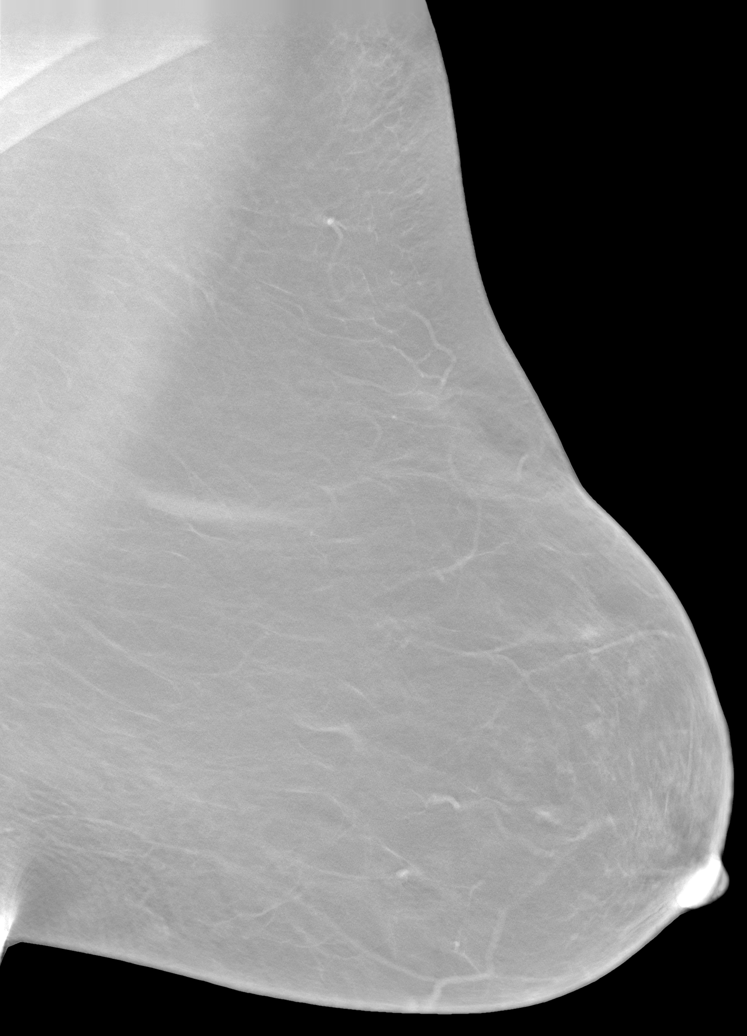

[7 of 24 positions shown; findings below may reference images not displayed]

FINDINGS: Breast tissues are predominantly fatty.  No new mass or architectural changes are noted.  Benign calcific foci in both breasts are stable.  No skin changes, nipple changes or duct dilation are seen.
IMPRESSION: 1.  Stable mammographic findings.  Clinical and mammographic followup at 12 months. 

2.  BIRADS 2-Benign findings. Patient has been added in a reminder system with a target date for the next screening mammography.

3.  DENSITY CODE – A (Almost entirely fatty). 

Final Assessment Code:

Bi-Rads 2 

BI-RADS 0
 Need additional imaging evaluation.

BI-RADS 1
 Negative mammogram.

BI-RADS 2
 Benign finding.

BI-RADS 3
 Probably benign finding; short-interval follow-up suggested.

BI-RADS 4
 Suspicious abnormality; biopsy should be considered.

BI-RADS 5
 Highly suggestive of malignancy; appropriate action should be taken.

BI-RADS 6
 Known biopsy-proven malignancy; appropriate action should be taken.

NOTE:
In compliance with Federal regulations, the results of this mammogram are being sent to the patient.

------------- REPORT [id number] -------------
[facility]
[address]
Tiger Ms.NDODOL, MDMILLIONISLAM:
We wish to report the following on your recent mammography examination. We are sending a report to your referring physician or other health care provider. 
(       Normal/Negative:
No evidence of cancer.
This statement is mandated by the [place], Department of Health.
Your examination was performed by one of our technologists, who are registered radiological technologists and also specially certified in mammography:
___
Markland, Marjuan (M)

Your mammogram was interpreted by our radiologist.

( 
Collette Sedman, M.D.

(Annual Breast Examination by a physician or other health care provider
(Annual Mammography Screening beginning at age 40
(Monthly Breast Self Examination

## 2022-05-13 LAB — LAB: COLOGUARD RESULT: NEGATIVE

## 2022-06-14 IMAGING — MG 3D SCREENING MAMMO BIL AND TOMO
5 series · 8 of 24 positions shown · non-contrast
Comparison: Exam dated 01/13/2023, 01/01/2022.

------------- REPORT [id number] -------------
[facility]
[address]
We wish to report the following on your recent mammography examination. We are sending a report to your referring physician or other health care provider.
INDICATION: Asymptomatic 81-year-old with family history of breast cancer in her sister.  Lifetime breast cancer risk 3.4%.

[L]
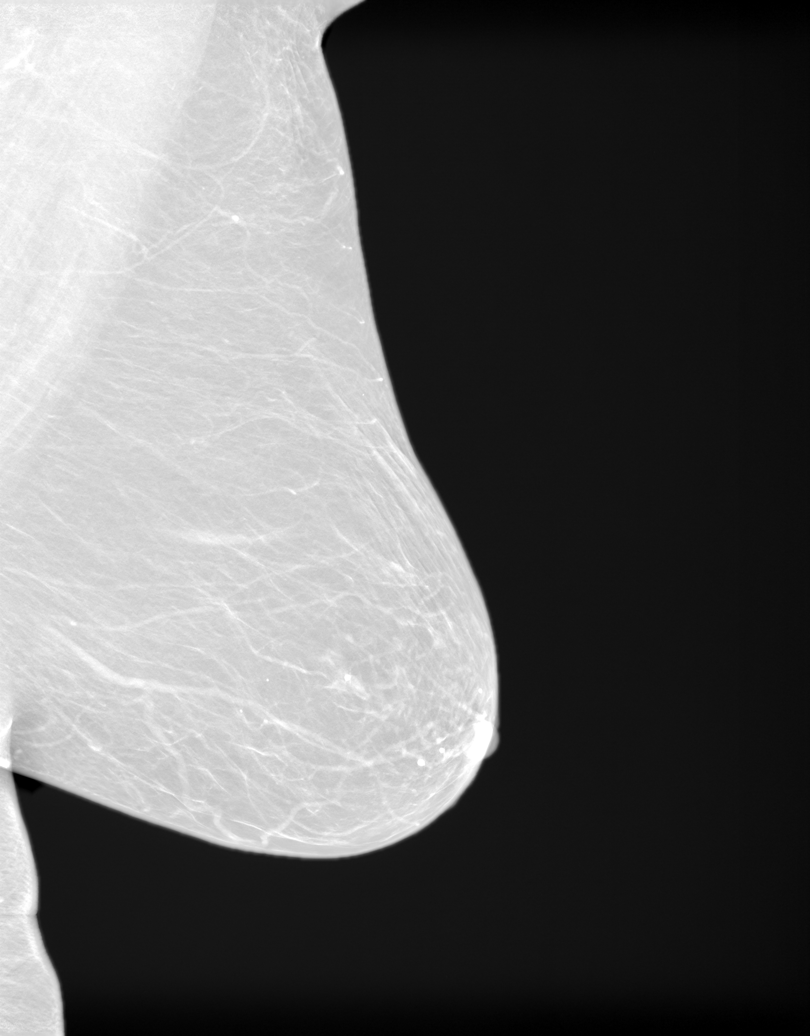

[R]
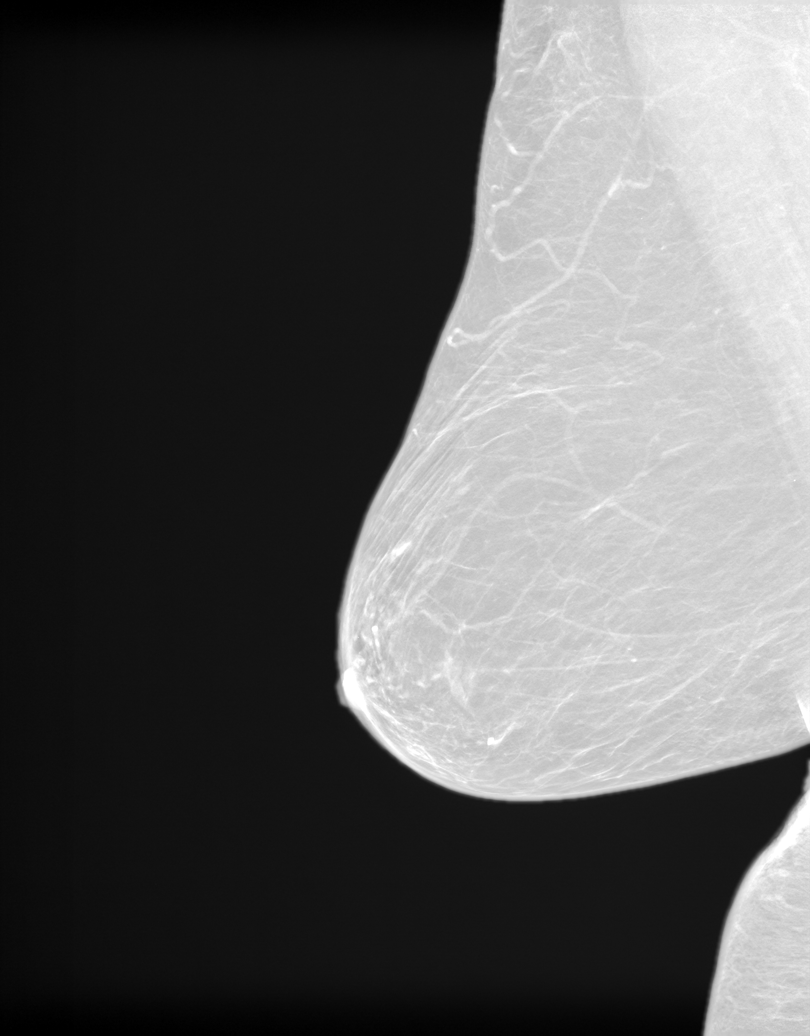

[L CC tomo · left · 0.10mm/px · 2 of 2 slices shown]
[im 1/2]
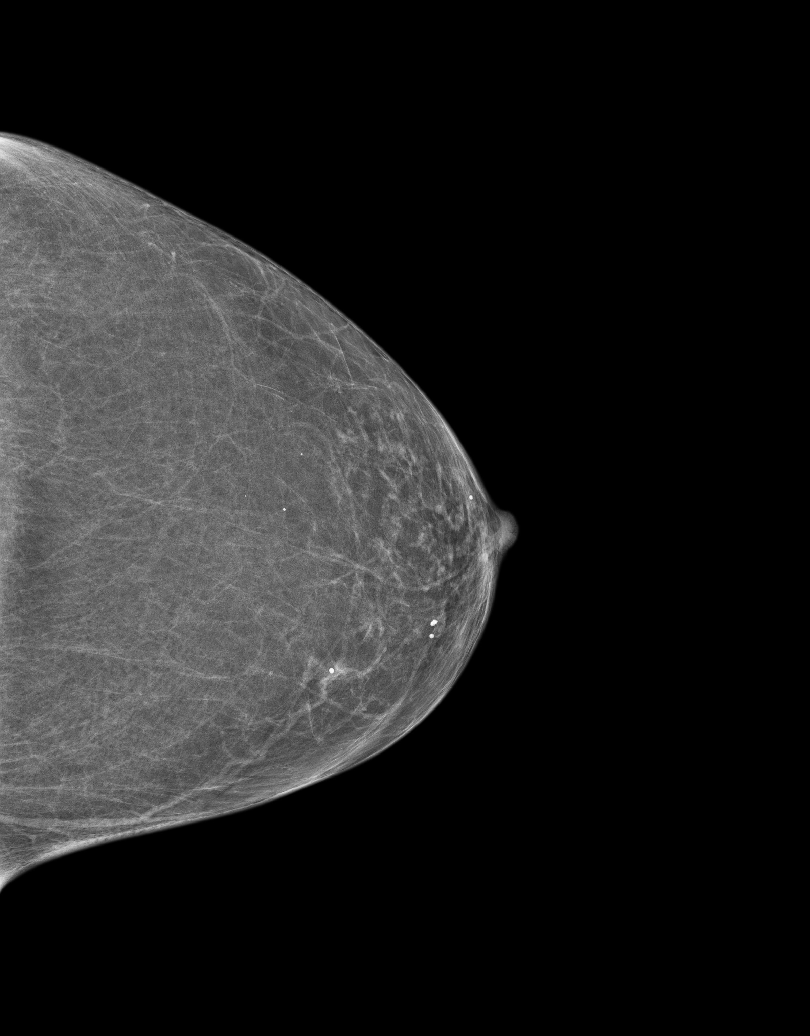
[im 2/2]
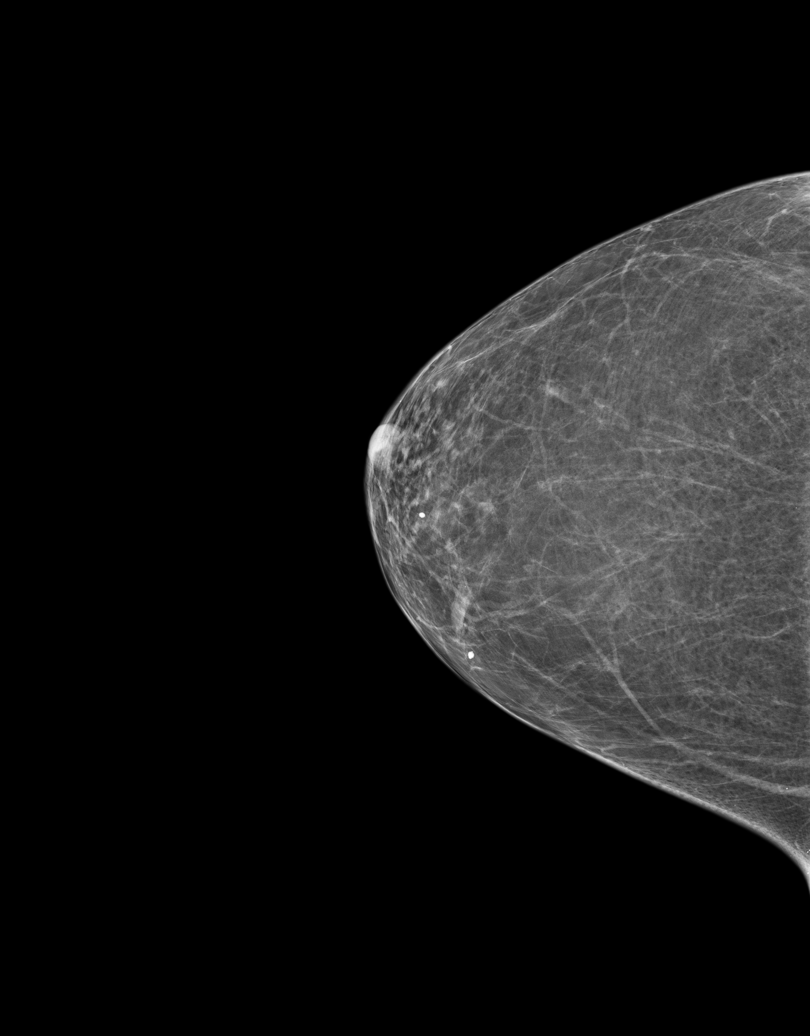

[3D SCREENING MAMMO BIL AND TOMO tomo · 2 acquisitions, 3 frames shown (1 of 2)]
[im 1/2]
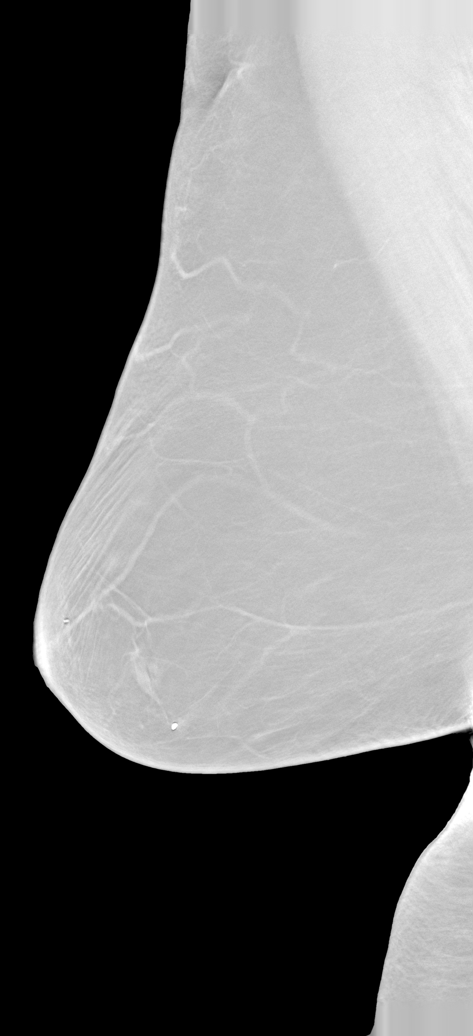
[im 2/2]
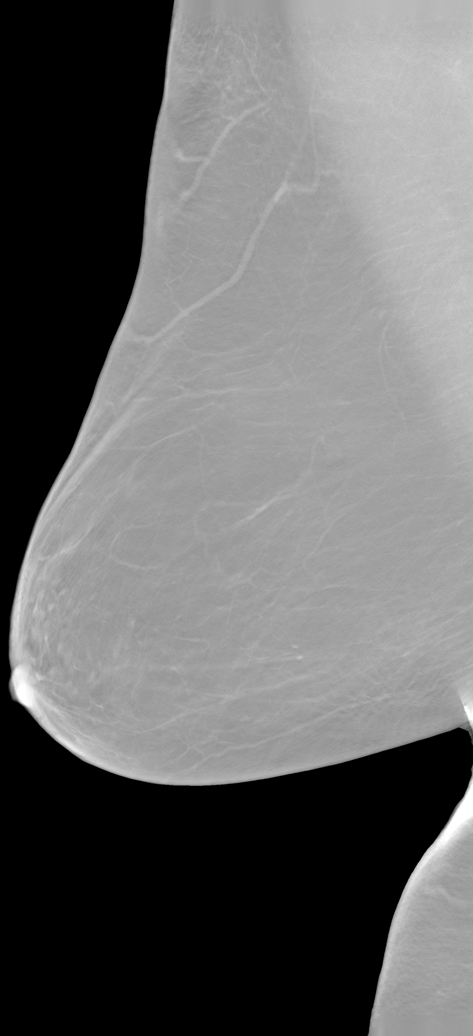
[im 2/2]
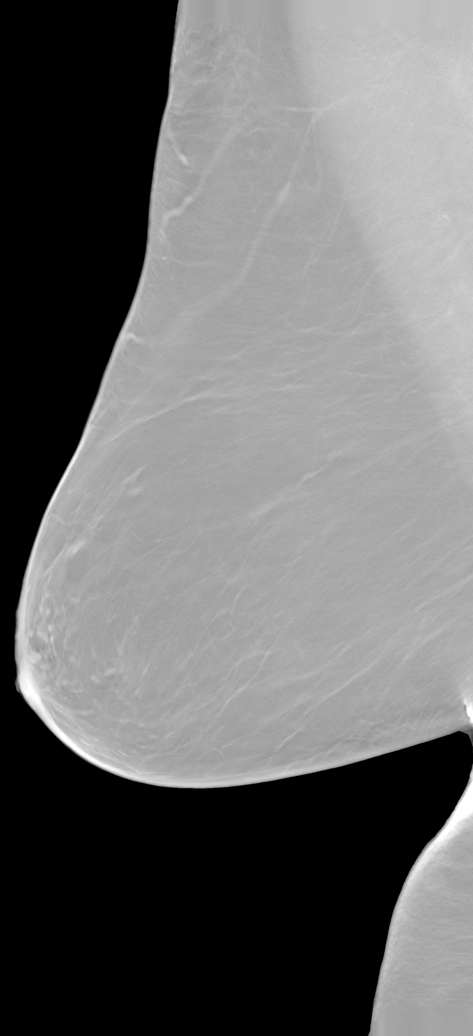

[3D SCREENING MAMMO BIL AND TOMO tomo (2 of 2) · tomo slice 9/54.0]
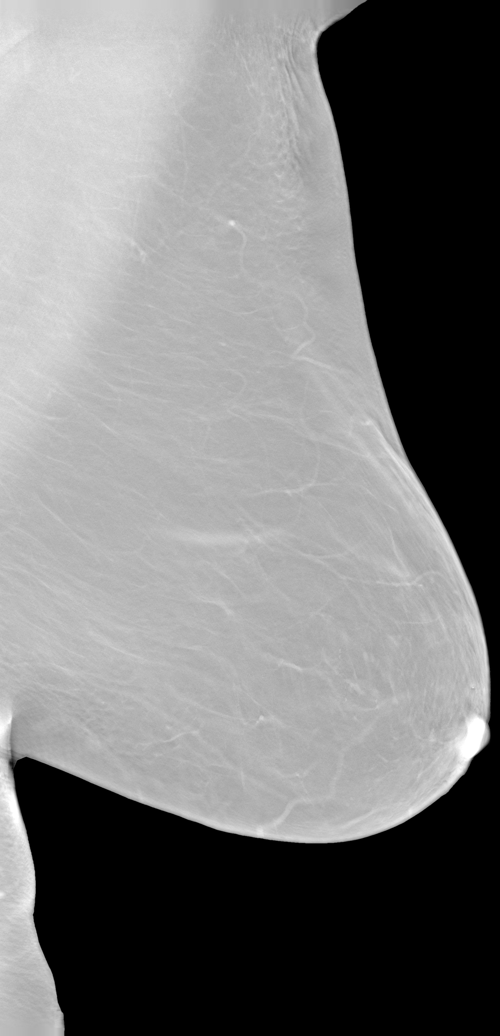

[8 of 24 positions shown; findings below may reference images not displayed]

FINDING: Normal-no evidence of cancer

This statement is mandated by [place], Department of Health.
Your examination was performed by one of our technologists, who are registered radiological technologists and also specially certified in mammography:
___
Herradura, Alkie (M)

Your mammogram was interpreted by our radiologist.

( 
Amore Pho, M.D.

(Annual Breast Examination by a physician or other health care provider
(Annual Mammography Screening beginning at age 40
(Monthly Breast Self Examination

------------- REPORT [id number] -------------
﻿

EXAM:  3D SCREENING MAMMO BIL AND TOMO
FINDINGS: No new mass or architectural changes are noted.  No abnormal calcific densities, skin changes, nipple changes or duct dilation are seen.
IMPRESSION: 1.  Stable mammographic findings. 

2.  BIRADS 2-Benign findings. Patient has been added in a reminder system with a target date for the next screening mammography.

3.  DENSITY CODE – A (Almost entirely fatty). 

Final Assessment Code:

BI-RADS 0
 Need additional imaging evaluation.

BI-RADS 1
 Negative mammogram.

BI-RADS 2
 Benign finding.

BI-RADS 3
 Probably benign finding; short-interval follow-up suggested.

BI-RADS 4
 Suspicious abnormality; biopsy should be considered.

BI-RADS 5
 Highly suggestive of malignancy; appropriate action should be taken.

BI-RADS 6
 Known biopsy-proven malignancy; appropriate action should be taken.

NOTE:
In compliance with Federal regulations, the results of this mammogram are being sent to the patient.

## 2022-07-18 IMAGING — DX XRAY LUMBAR SPINE [DATE] VIEWS
1 series · 2 of 2 positions shown · non-contrast
Comparison: None available.

﻿EXAM:  [DATE]      XRAY LUMBAR SPINE [DATE] VIEWS,XRAY THORACIC SPINE 2 VIEWS
INDICATION: Trauma due to fall a few years ago.  Back pain. Numbness.
TECHNIQUE: AP and lateral views of the thoracic spine and lumbosacral spine were obtained.

[Series 1: AP · 0.14mm/px · 2 of 2 slices shown]
[im 1/2]
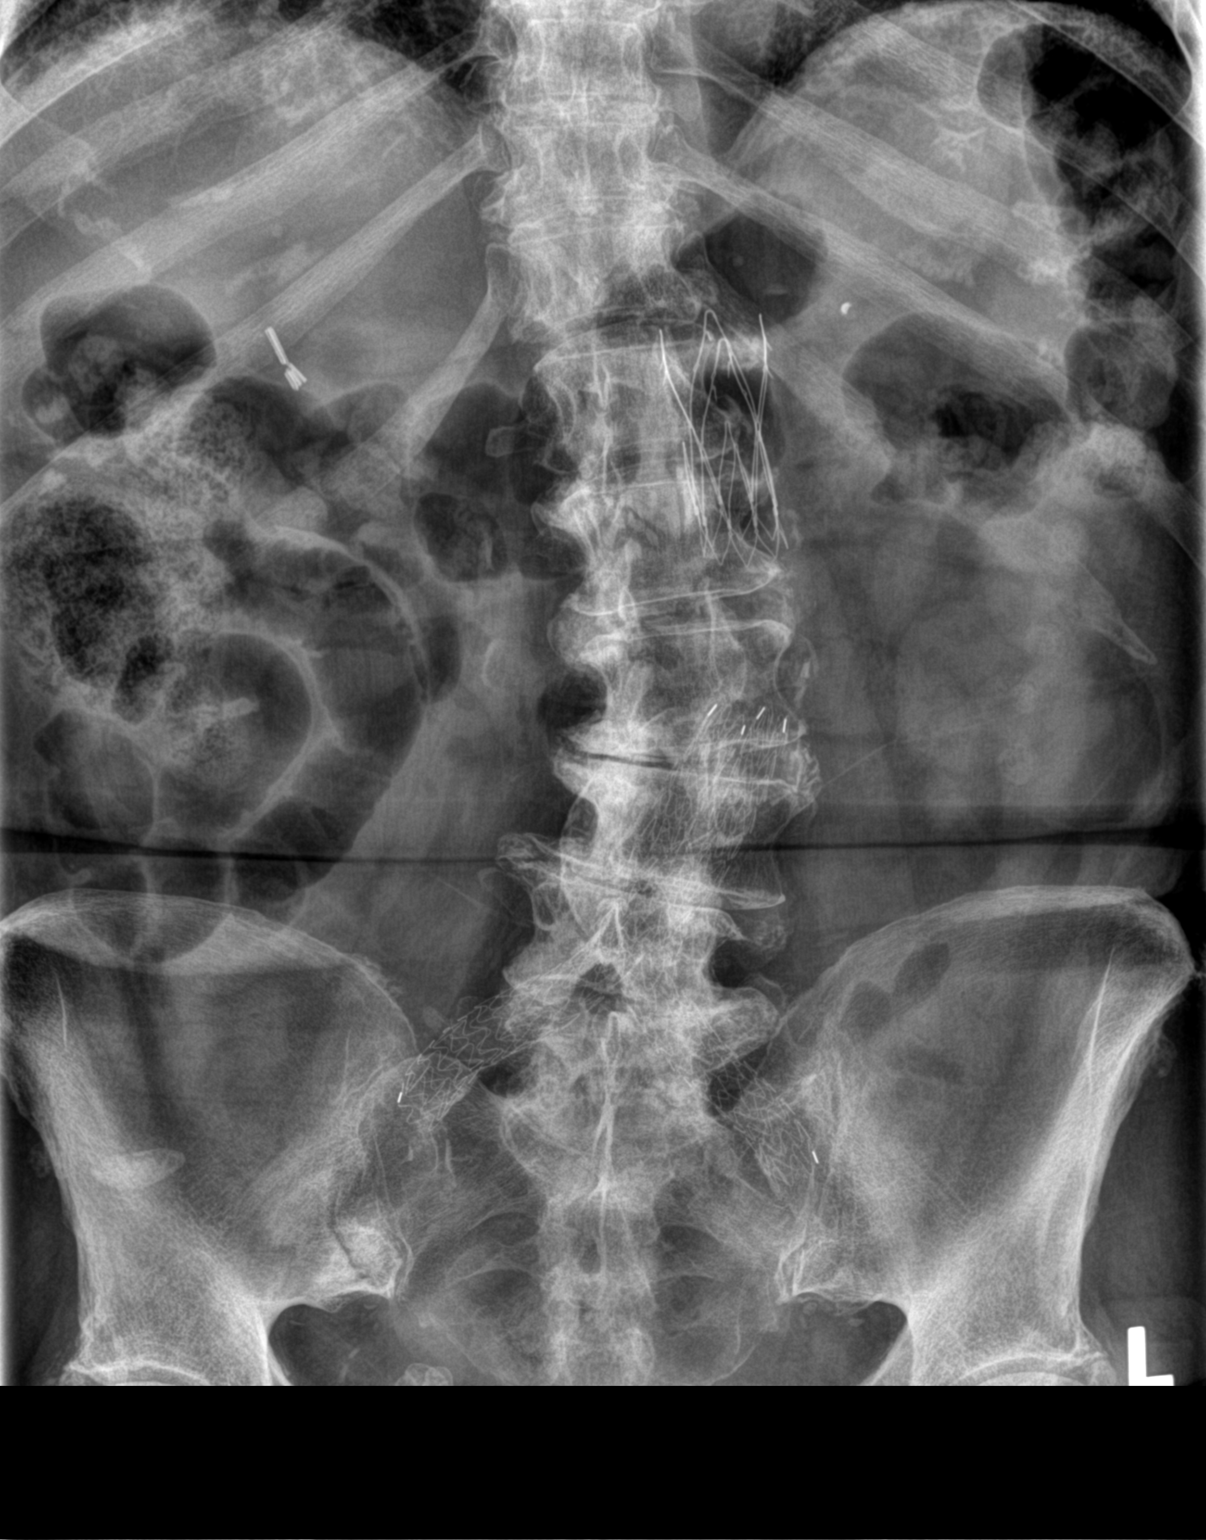
[im 2/2]
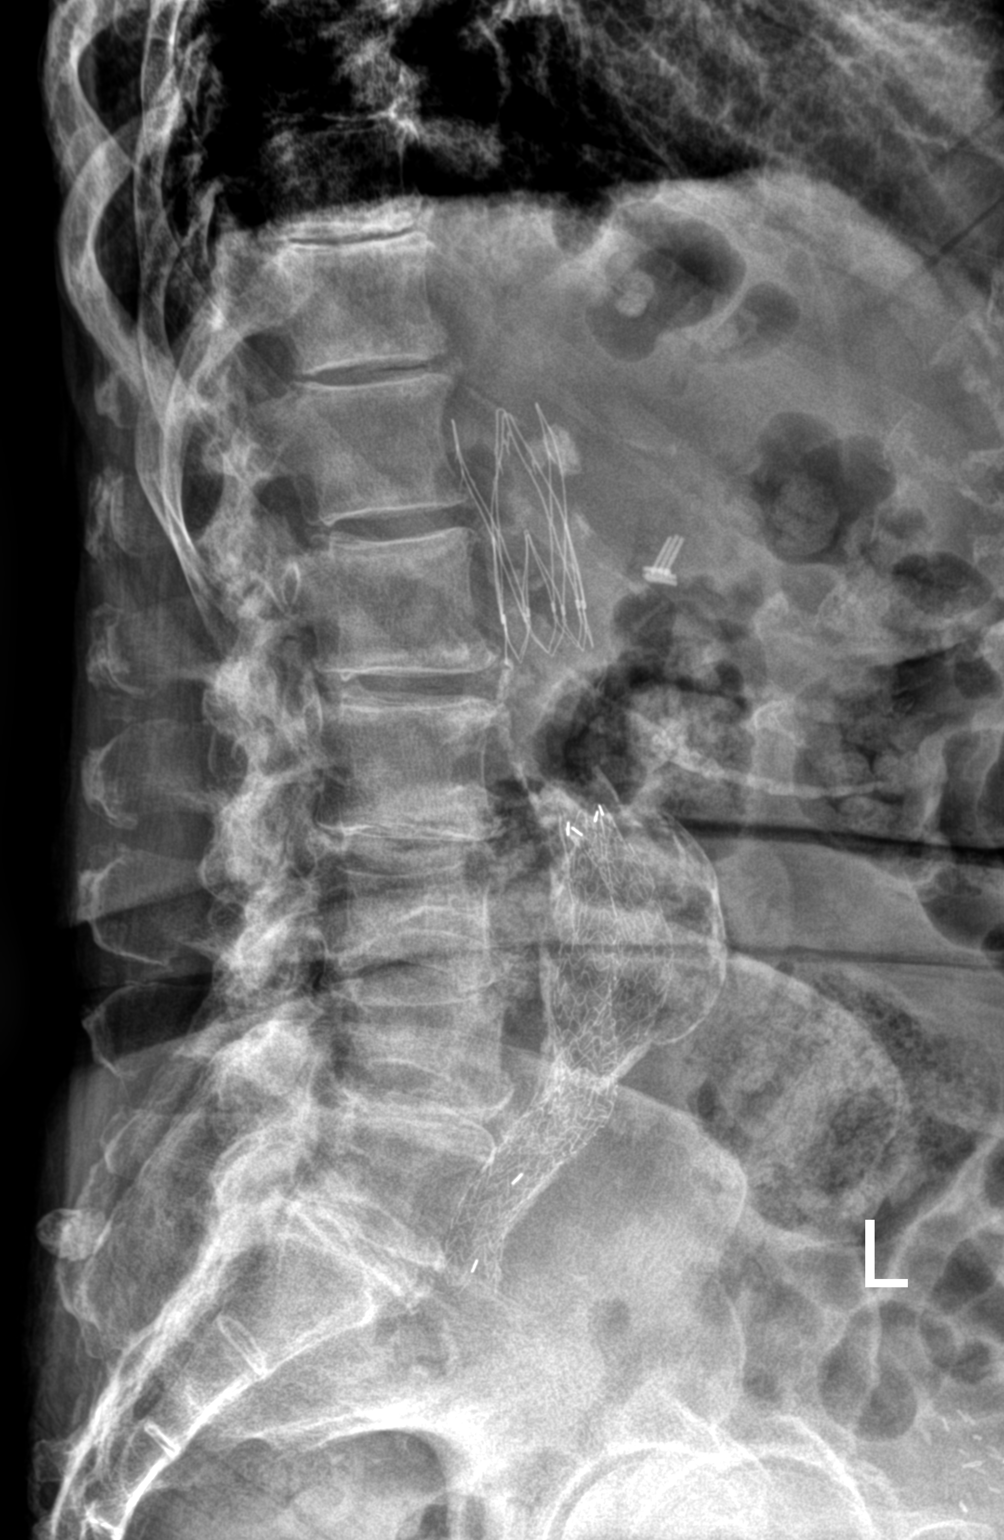

[2 of 2 positions shown; findings below may reference images not displayed]

FINDINGS: No acute bony lesions of the thoracic and lumbar vertebrae are seen.  Advanced degenerative disc disease is noted in the mid and lower lumbar spine.  Significant degenerative disc changes of multiple mid and lower thoracic discs.

Paravertebral soft tissues show evidence of abdominal aortic aneurysm with postsurgical changes of endovascular graft placement.
IMPRESSION: 1.  Advanced multilevel degenerative disc changes of lumbar spine in the mid and lower lumbar spine. Severe degenerative disc changes of multiple mid and lower thoracic discs.  If symptoms warrant, further imaging with MRI is recommended. 

 2. Abdominal aortic aneurysm with postoperative changes of endograft placement.

## 2022-07-18 IMAGING — DX XRAY THORACIC SPINE 2 VIEWS
1 series · 2 of 2 positions shown · non-contrast
Comparison: None available.

﻿EXAM:  [DATE]      XRAY LUMBAR SPINE [DATE] VIEWS,XRAY THORACIC SPINE 2 VIEWS
INDICATION: Trauma due to fall a few years ago.  Back pain. Numbness.
TECHNIQUE: AP and lateral views of the thoracic spine and lumbosacral spine were obtained.

[Series 1: AP · 0.14mm/px · 2 of 2 slices shown]
[im 1/2]
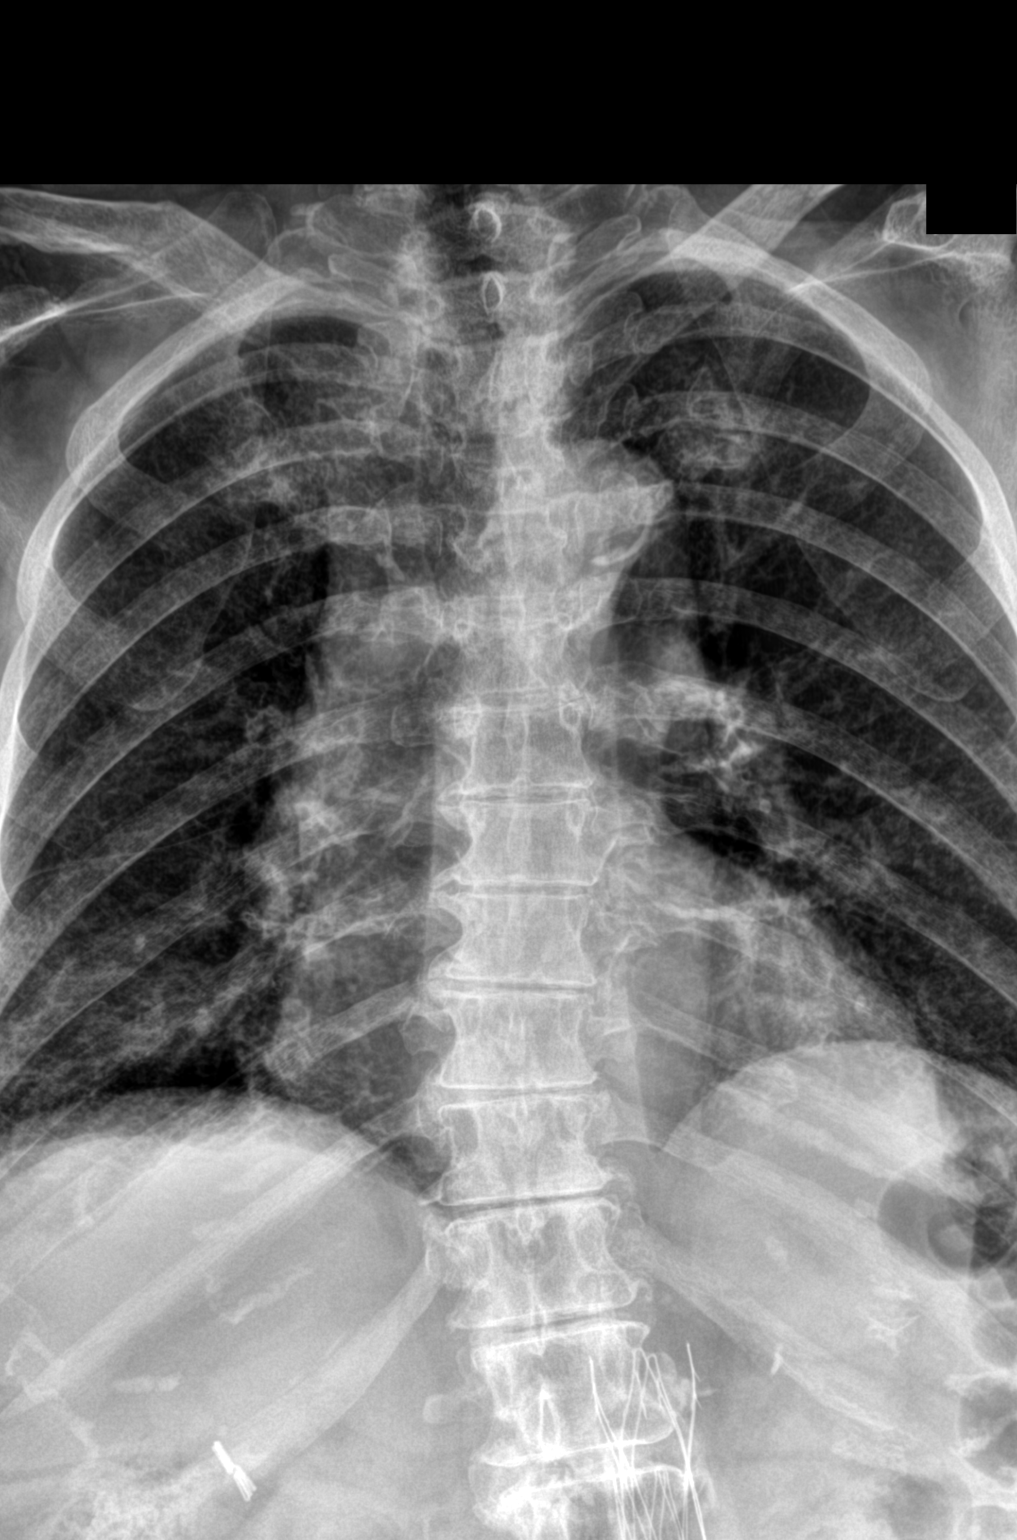
[im 2/2]
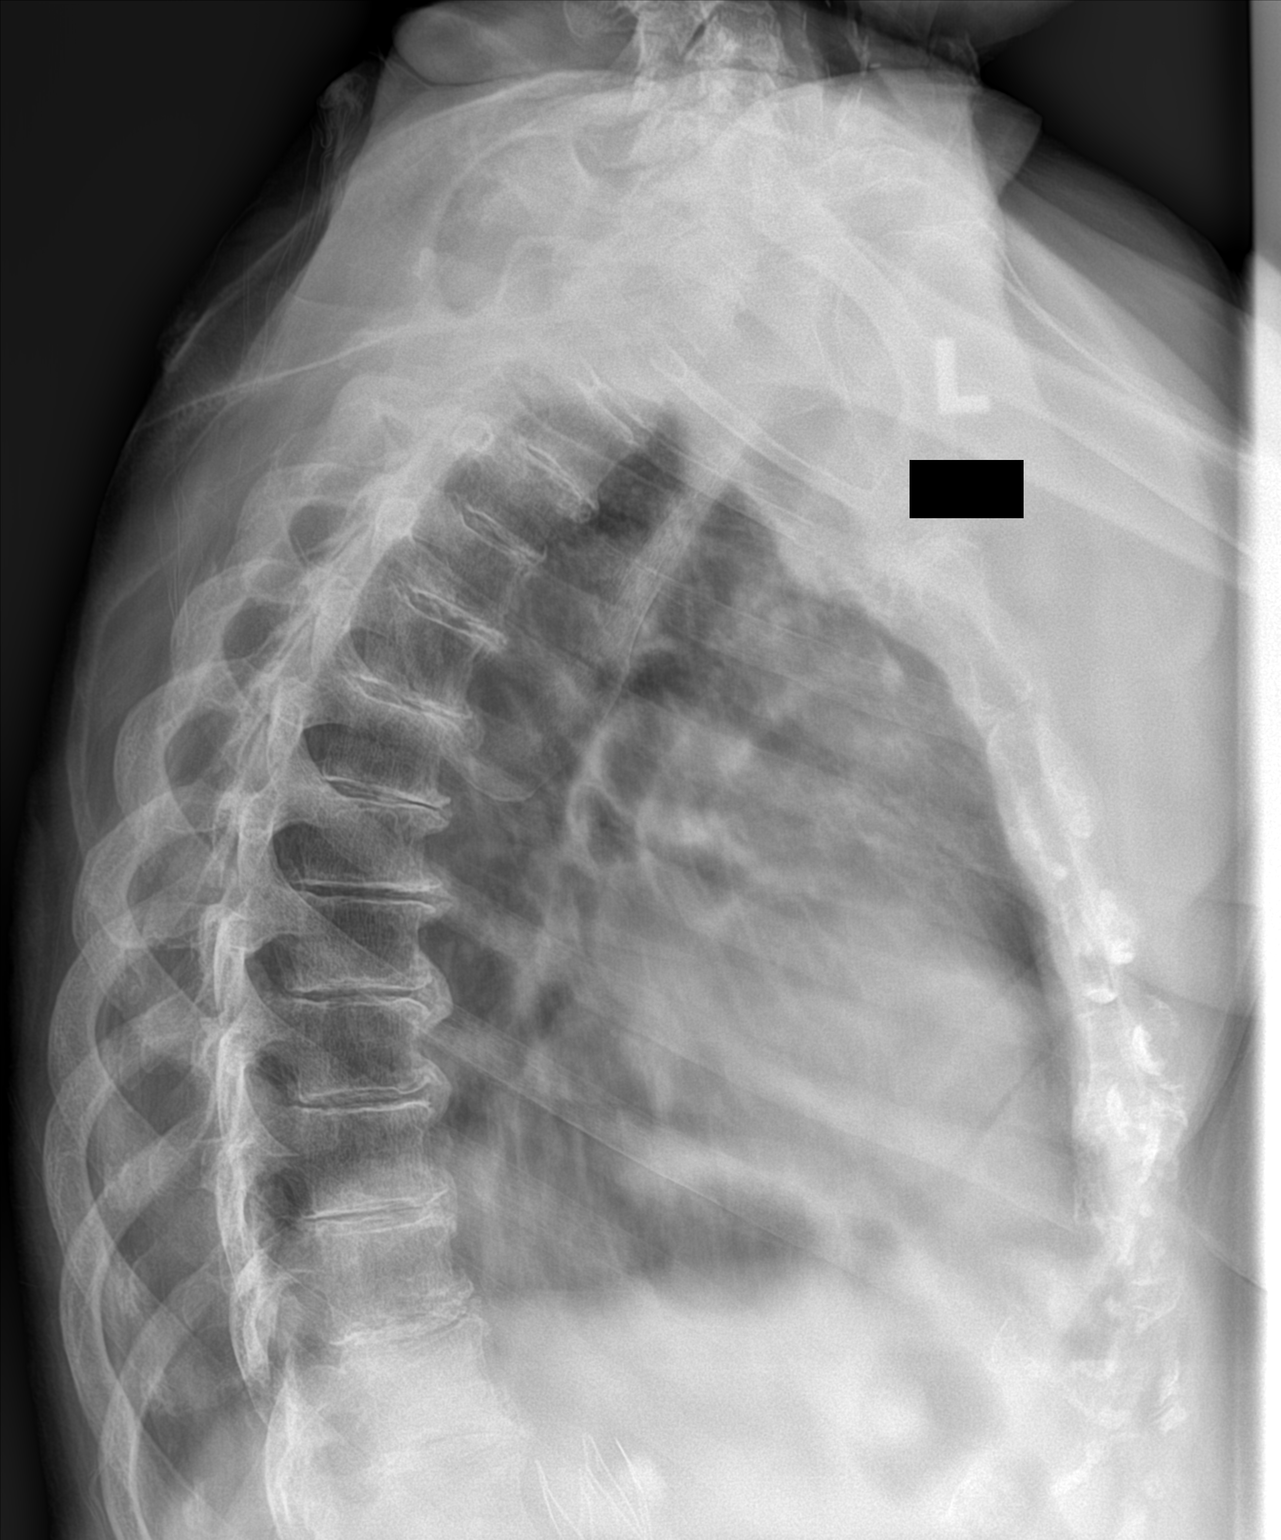

[2 of 2 positions shown; findings below may reference images not displayed]

FINDINGS: No acute bony lesions of the thoracic and lumbar vertebrae are seen.  Advanced degenerative disc disease is noted in the mid and lower lumbar spine.  Significant degenerative disc changes of multiple mid and lower thoracic discs.

Paravertebral soft tissues show evidence of abdominal aortic aneurysm with postsurgical changes of endovascular graft placement.
IMPRESSION: 1.  Advanced multilevel degenerative disc changes of lumbar spine in the mid and lower lumbar spine. Severe degenerative disc changes of multiple mid and lower thoracic discs.  If symptoms warrant, further imaging with MRI is recommended. 

 2. Abdominal aortic aneurysm with postoperative changes of endograft placement.

## 2022-08-21 IMAGING — MR MRI LUMBAR SPINE WITHOUT CONTRAST
5 of 6 series · 31 of 48 positions shown · non-contrast
Comparison: Previous thoracic and lumbar spine x-rays dated 07/18/22.

﻿EXAM:  22162   MRI LUMBAR SPINE WITHOUT CONTRAST
INDICATION: 81-year old female with chronic low back pain.  Radicular symptoms to both hips and both legs.  No history of malignancy or back surgery.
TECHNIQUE: Axial, coronal and sagittal images were obtained following standard protocol.

[Series 5: T2 · sagittal · 4.0mm · 0.94mm/px · 6 of 17 slices shown (1 of 3)]
[im 1/17]
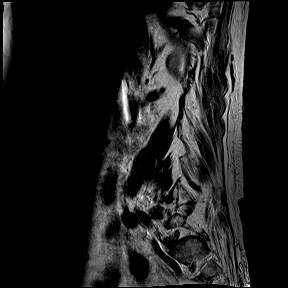
[im 4/17]
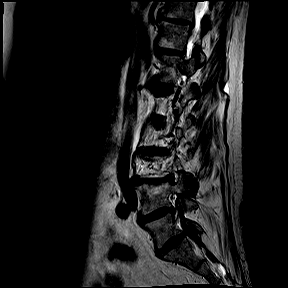
[im 7/17]
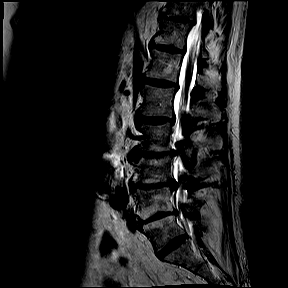
[im 10/17]
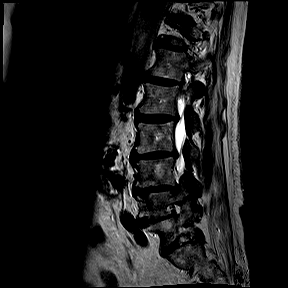
[im 13/17]
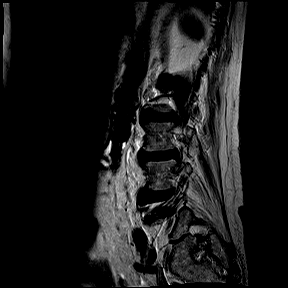
[im 17/17]
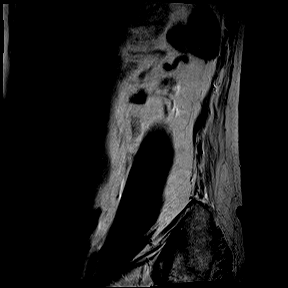

[Series 6: T1 · sagittal · 4.0mm · 0.94mm/px · 6 of 17 slices shown (1 of 2)]
[im 1/17]
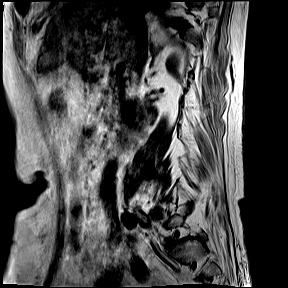
[im 4/17]
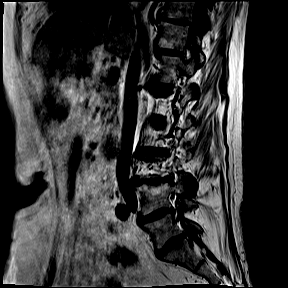
[im 7/17]
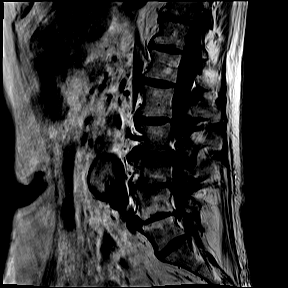
[im 10/17]
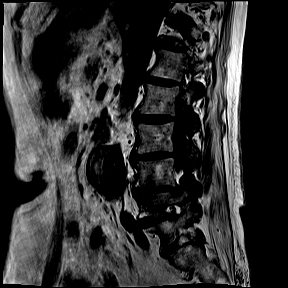
[im 13/17]
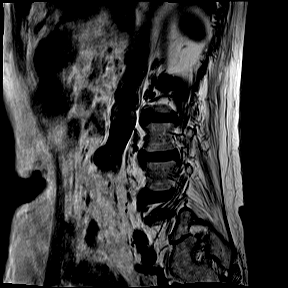
[im 17/17]
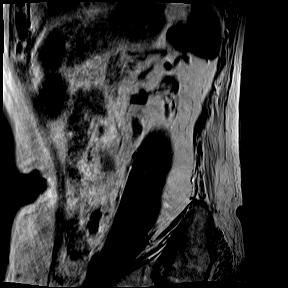

[Series 7: T2 · coronal · 5.0mm · 0.82mm/px · 6 of 18 slices shown (2 of 3)]
[im 1/18]
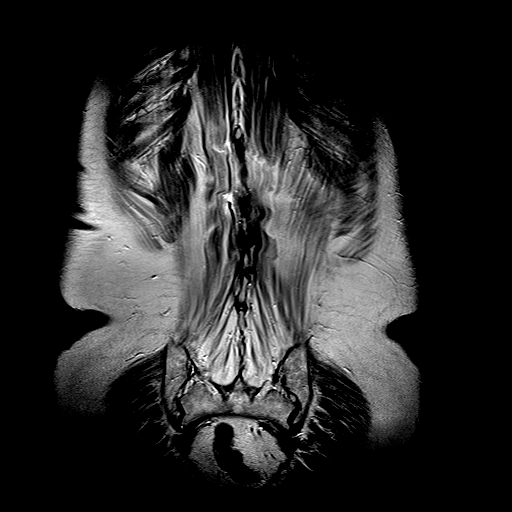
[im 4/18]
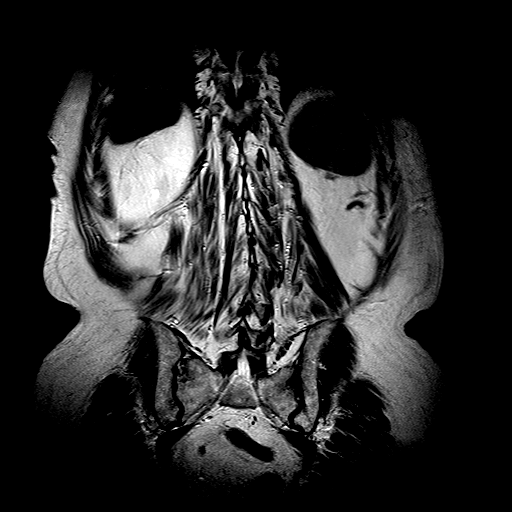
[im 7/18]
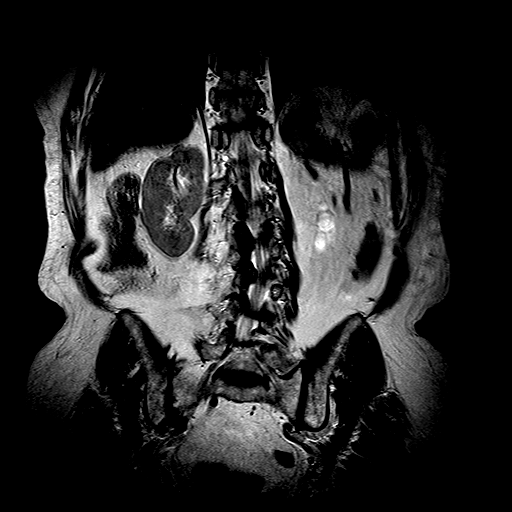
[im 11/18]
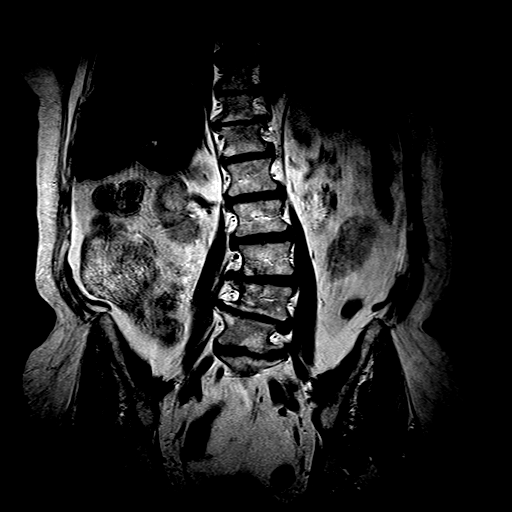
[im 14/18]
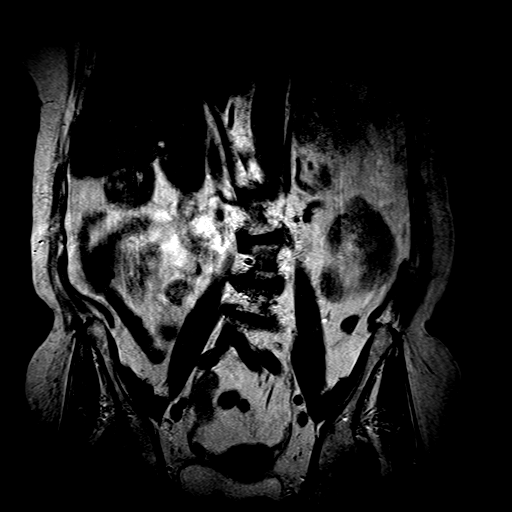
[im 18/18]
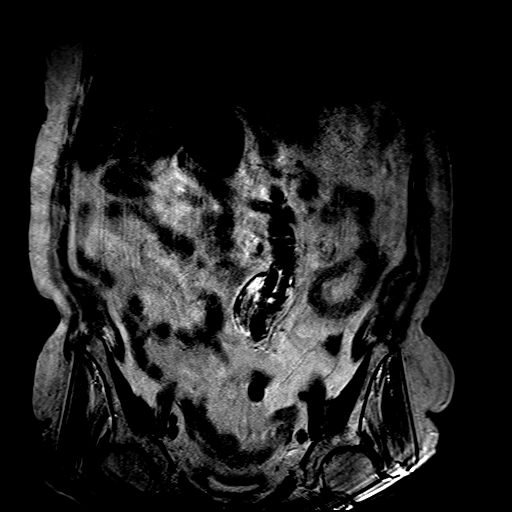

[Series 9: T2 · axial · 4.0mm · 0.52mm/px · z∈[-160,+124]mm · 9 of 32 slices shown (3 of 3)]
[im 1/32]
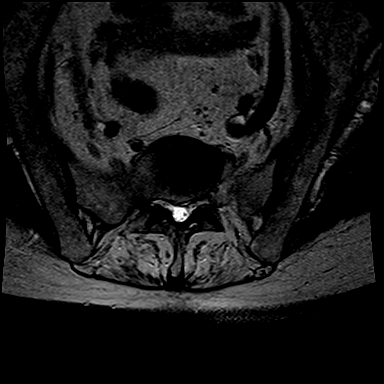
[im 6/32]
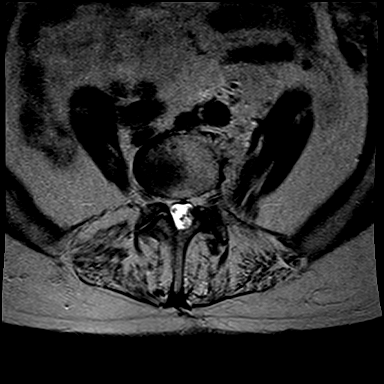
[im 9/32]
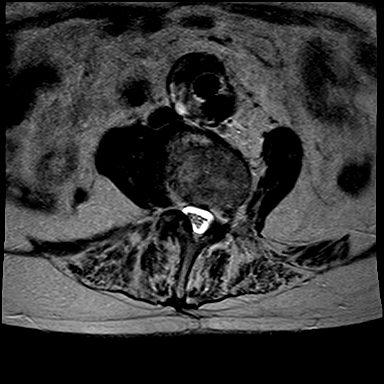
[im 15/32]
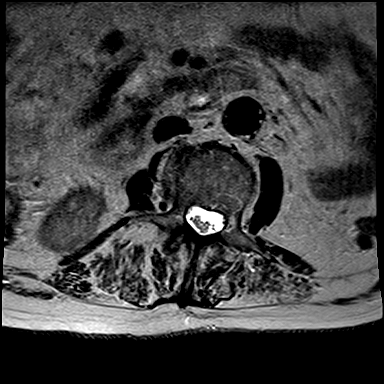
[im 17/32]
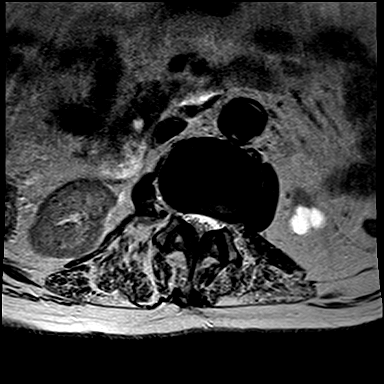
[im 23/32]
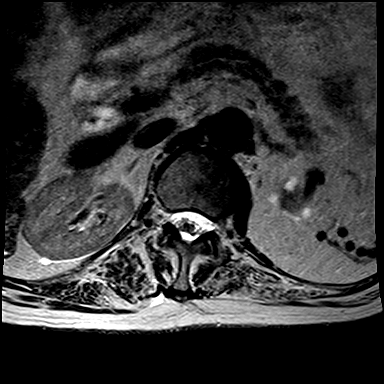
[im 26/32]
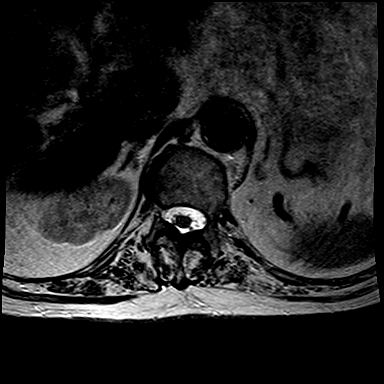
[im 29/32]
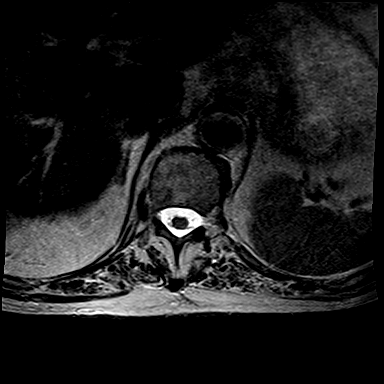
[im 32/32]
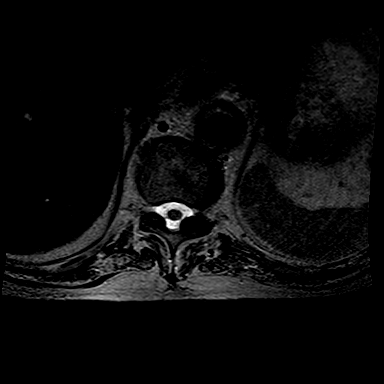

[Series 10: T1 · axial · 4.0mm · 0.52mm/px · z∈[-160,+1]mm · 4 of 32 slices shown (2 of 2)]
[im 1/32]
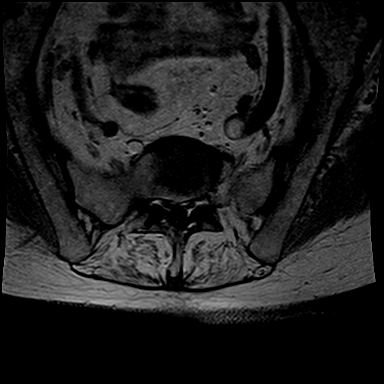
[im 6/32]
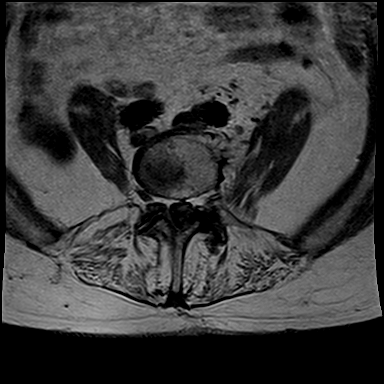
[im 9/32]
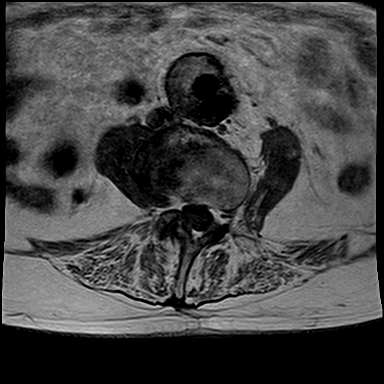
[im 15/32]
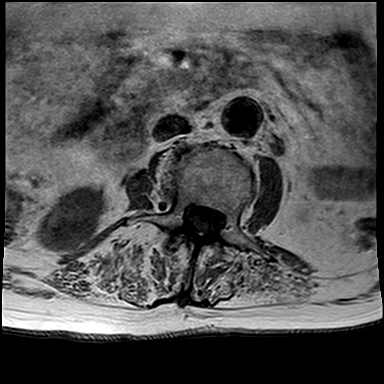

[31 of 48 positions shown; findings below may reference images not displayed]

FINDINGS: Scoliosis of lumbar spine measuring 24 degrees in curvature convex to the left is noted.  Transitional lumbosacral junction is noted with evidence suggestive of 6 non rib bearing lumbar vertebrae based on the review of radiographic studies of 07/18/22 and based on the MRI findings.  Lowest visible disc in sagittal projection is labeled as L6-S1 for the purposes of this report.  

Lower spinal cord and cauda equina do not show any focal findings.  

At T12-L1 level, significant degenerative disc disease with facet arthropathy are noted causing minimal compromise of both lateral recess.  

At L1-2 level, degenerative disc disease and facet arthropathy is causing mild compromise of thecal sac and moderate compromise of both lateral recess.  AP diameter of thecal sac in the midline measures 8 mm.  

At L2-3 level, degenerative disc disease and significant facet arthropathy are causing moderate compromise of both lateral recess and moderate compromise of thecal sac with AP diameter in the midline measuring 8.3 mm.  

At L3-4 level, severe degenerative disc disease with Modic Type I inflammatory changes on both sides of the disc.  Severe bilateral facet arthropathy with hypertrophy of ligaments.  Combination of facet arthropathy and degenerative disc disease is causing severe degree of compromise of both lateral recess and significant spinal stenosis with AP diameter in the midline measuring 7.4 mm.  

At L4-5 level, severe degenerative disc disease with asymmetric bulging annulus and significant facet arthropathy are causing significant degree of spinal stenosis with AP diameter in the midline measuring 4.1 mm and severe compromise of lateral recess and neural foramen particularly on the left side.  Mild Modic Type I inflammatory change on both sides of L4-5 disc is noted.  

At L5-6 level, degenerative disc disease and facet arthropathy are causing moderately significant compromise of both lateral recess and neural foramen.  

L6-S1 disc shows degenerative disc changes and facet arthropathy causing moderate biforaminal stenosis.  

Paravertebral soft tissues show presence of abdominal aortic aneurysm with endograph placement with maximum AP diameter of aneurysm sac at L4 measuring 4 cm.
IMPRESSION: 1. Transitional lumbosacral junction as mentioned above with 6 lumbar vertebrae and lowest visible disc labeled as L6-S1 for the purposes of this report.  Any surgical procedure must be preceded by confirmation of disc level.  

2. Multi-level degenerative disc changes and facet arthropathy as described above in detail at each level.  Most prominent findings are noted at L4-5 and L3-4 levels and lesser changes at other levels.  

3. Abdominal aortic aneurysm with endograph placement is noted as described above.

## 2022-09-24 ENCOUNTER — Encounter (INDEPENDENT_AMBULATORY_CARE_PROVIDER_SITE_OTHER): Payer: Self-pay | Admitting: Nephrology

## 2022-09-24 ENCOUNTER — Encounter (INDEPENDENT_AMBULATORY_CARE_PROVIDER_SITE_OTHER): Payer: Self-pay

## 2022-09-24 DIAGNOSIS — I10 Essential (primary) hypertension: Secondary | ICD-10-CM | POA: Insufficient documentation

## 2022-09-24 DIAGNOSIS — N1832 Chronic kidney disease, stage 3b (CMS HCC): Secondary | ICD-10-CM | POA: Insufficient documentation

## 2022-09-25 ENCOUNTER — Other Ambulatory Visit: Payer: Self-pay

## 2022-09-25 ENCOUNTER — Ambulatory Visit (INDEPENDENT_AMBULATORY_CARE_PROVIDER_SITE_OTHER): Payer: Medicare Other | Admitting: Nephrology

## 2022-09-25 ENCOUNTER — Encounter (INDEPENDENT_AMBULATORY_CARE_PROVIDER_SITE_OTHER): Payer: Self-pay | Admitting: Nephrology

## 2022-09-25 VITALS — BP 167/88 | HR 82 | Ht 64.0 in | Wt 142.0 lb

## 2022-09-25 DIAGNOSIS — N184 Chronic kidney disease, stage 4 (severe): Secondary | ICD-10-CM

## 2022-09-25 DIAGNOSIS — D631 Anemia in chronic kidney disease: Secondary | ICD-10-CM

## 2022-09-25 DIAGNOSIS — I129 Hypertensive chronic kidney disease with stage 1 through stage 4 chronic kidney disease, or unspecified chronic kidney disease: Secondary | ICD-10-CM

## 2022-09-25 DIAGNOSIS — E559 Vitamin D deficiency, unspecified: Secondary | ICD-10-CM

## 2022-09-25 DIAGNOSIS — I10 Essential (primary) hypertension: Secondary | ICD-10-CM

## 2022-09-25 DIAGNOSIS — N261 Atrophy of kidney (terminal): Secondary | ICD-10-CM

## 2022-09-25 MED ORDER — AMLODIPINE 5 MG TABLET
5.0000 mg | ORAL_TABLET | Freq: Every day | ORAL | 4 refills | Status: DC
Start: 2022-09-25 — End: 2023-01-02

## 2022-09-25 NOTE — Progress Notes (Signed)
[facility], [facility]    Progress Note    Name: Loretta Crawford MRN:  [id number]   Date: 09/25/2022 Age: 81 y.o.          Nephrology New Patient Visit       Reason for the visit/consultation:  CKD 4  HPI : 81 y.o. 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     OBJECTIVE:   BP (!) 167/88   Pulse 82   Ht 1.626 m (5\' 4" )   Wt 64.4 kg (142 lb)   BMI 24.37 kg/m       General:  NAD, AAOx3  HEENT:  EOMI,  no icterus  NECK: No increased JVD.  Normal inspection   HEART: Normal S1 and S2. Regular rhythm. No murmurs or rubs. No chest wall tenderness   LUNGS: Clear to auscultation bilateral. No wheezes, rales, or rhonchi.   ABDOMEN: +BS, Soft, nontender and nondistended. No rebound or guarding present.   EXTREMITIES: No edema. No cyanosis or clubbing.No asterixis    NEURO : Normal speech, EOMI.       LABORATORY DATA:   No results found for: "BUN", "CREATININE", "BUNCRRATIO", "GFR", "SODIUM", "POTASSIUM", "CHLORIDE", "CO2", "ANIONGAP", "CALCIUM", "PHOSPHORUS", "ALBUMIN", "HGB", "HCT", "INTACTPTH", "IRON", "IRONBINDCAP", "IRONSAT", "FERRITIN", "25HYDVITD2D3", "25HYDVITD3"    No results found for: "MICROALBUMIN", "TOTPROTCREAT", "HA1C", "URICACID"    MEDICATIONS:  Outpatient Medications Marked as Taking for the 09/25/22 encounter (Office Visit) with Richardean Sale, MD   Medication Sig    apixaban (ELIQUIS) 5 mg Oral Tablet Take by mouth Twice daily    ergocalciferol, vitamin D2, (DRISDOL) 1,250 mcg (50,000 unit) Oral  Capsule     lisinopriL (PRINIVIL) 20 mg Oral Tablet Take 1 Tablet (20 mg total) by mouth Once a day    pravastatin (PRAVACHOL) 10 mg Oral Tablet        ASSESSMENT / PLAN:   ENCOUNTER DIAGNOSES     ICD-10-CM   1. CKD (chronic kidney disease) stage 4, GFR 15-29 ml/min (CMS HCC)  N18.4   2. Essential (primary) hypertension  I10   3. Atrophy of right kidney  N26.1   4. Anemia in stage 4 chronic kidney disease (CMS HCC)   N18.4    D63.1   5. Vitamin D deficiency  E55.9      Plan:  CKD stage 4 stay away from NSAIDs keep blood pressure controlled drink plenty of fluids see her back in  months check lab and kidney ultrasound before next visit  Essential hypertension blood pressure is on the high side stay away from salt will add Norvasc 5 mg once a day continue lisinopril keep checking blood pressure make sure is better controlled  Vitamin-D deficiency continue vitamin-D supplements check levels  Richardean Sale, MD

## 2022-09-25 NOTE — Addendum Note (Signed)
Addended by: Suszanne Finch on: 09/25/2022 01:36 PM     Modules accepted: Orders

## 2022-12-26 ENCOUNTER — Other Ambulatory Visit (INDEPENDENT_AMBULATORY_CARE_PROVIDER_SITE_OTHER): Payer: Self-pay | Admitting: Nephrology

## 2022-12-26 ENCOUNTER — Other Ambulatory Visit (INDEPENDENT_AMBULATORY_CARE_PROVIDER_SITE_OTHER): Payer: Self-pay

## 2022-12-26 ENCOUNTER — Other Ambulatory Visit: Payer: Self-pay

## 2022-12-26 ENCOUNTER — Inpatient Hospital Stay
Admission: RE | Admit: 2022-12-26 | Discharge: 2022-12-26 | Disposition: A | Discharge status: Home / Self Care | Payer: Medicare Other | Source: Ambulatory Visit | Attending: Nephrology | Admitting: Nephrology

## 2022-12-26 ENCOUNTER — Other Ambulatory Visit (HOSPITAL_COMMUNITY): Payer: Medicare Other

## 2022-12-26 DIAGNOSIS — E559 Vitamin D deficiency, unspecified: Secondary | ICD-10-CM

## 2022-12-26 DIAGNOSIS — N261 Atrophy of kidney (terminal): Secondary | ICD-10-CM

## 2022-12-26 DIAGNOSIS — D631 Anemia in chronic kidney disease: Secondary | ICD-10-CM | POA: Insufficient documentation

## 2022-12-26 DIAGNOSIS — N184 Chronic kidney disease, stage 4 (severe): Secondary | ICD-10-CM

## 2022-12-26 DIAGNOSIS — I10 Essential (primary) hypertension: Secondary | ICD-10-CM

## 2022-12-26 LAB — IRON TRANSFERRIN AND TIBC
IRON (TRANSFERRIN) SATURATION: 31 % (ref 15–50)
IRON: 102 ug/dL (ref 50–212)
TOTAL IRON BINDING CAPACITY: 329 ug/dL (ref 250–450)
TRANSFERRIN: 235 mg/dL (ref 203–362)
UIBC: 227 ug/dL (ref 130–375)

## 2022-12-26 LAB — COMPREHENSIVE METABOLIC PANEL, NON-FASTING
ALBUMIN/GLOBULIN RATIO: 1.3 (ref 0.8–1.4)
ALBUMIN: 4.2 g/dL (ref 3.5–5.7)
ALKALINE PHOSPHATASE: 43 U/L (ref 34–104)
ALT (SGPT): <7 U/L — ABNORMAL LOW (ref 7–52)
ANION GAP: 8 mmol/L (ref 4–13)
AST (SGOT): 21 U/L (ref 13–39)
BILIRUBIN TOTAL: 0.5 mg/dL (ref 0.3–1.2)
BUN/CREA RATIO: 19 (ref 6–22)
BUN: 45 mg/dL — ABNORMAL HIGH (ref 7–25)
CALCIUM, CORRECTED: 11.4 mg/dL — ABNORMAL HIGH (ref 8.9–10.8)
CALCIUM: 11.6 mg/dL — ABNORMAL HIGH (ref 8.6–10.3)
CHLORIDE: 102 mmol/L (ref 98–107)
CO2 TOTAL: 25 mmol/L (ref 21–31)
CREATININE: 2.32 mg/dL — ABNORMAL HIGH (ref 0.60–1.30)
ESTIMATED GFR: 21 mL/min/{1.73_m2} — ABNORMAL LOW (ref >59)
GLOBULIN: 3.2 (ref 2.9–5.4)
GLUCOSE: 93 mg/dL (ref 74–109)
OSMOLALITY, CALCULATED: 282 mOsm/kg (ref 270–290)
POTASSIUM: 4.7 mmol/L (ref 3.5–5.1)
PROTEIN TOTAL: 7.4 g/dL (ref 6.4–8.9)
SODIUM: 135 mmol/L — ABNORMAL LOW (ref 136–145)

## 2022-12-26 LAB — URIC ACID: URIC ACID: 6.7 mg/dL (ref 2.3–7.6)

## 2022-12-26 LAB — CBC
HCT: 42.7 % — ABNORMAL HIGH (ref 31.2–41.9)
HGB: 14.1 g/dL (ref 10.9–14.3)
MCH: 31.2 pg (ref 24.7–32.8)
MCHC: 33.1 g/dL (ref 32.3–35.6)
MCV: 94.3 fL (ref 75.5–95.3)
MPV: 9 fL (ref 7.9–10.8)
PLATELETS: 201 10*3/uL (ref 140–440)
RBC: 4.53 10*6/uL (ref 3.63–4.92)
RDW: 13.2 % (ref 12.3–17.7)
WBC: 7.9 10*3/uL (ref 3.8–11.8)

## 2022-12-26 LAB — VITAMIN B12: VITAMIN B 12: 444 pg/mL (ref 180–914)

## 2022-12-26 LAB — FOLATE: FOLATE: 14.8 ng/mL (ref 5.9–24.4)

## 2022-12-26 LAB — HGA1C (HEMOGLOBIN A1C WITH EST AVG GLUCOSE): HEMOGLOBIN A1C: 5.6 % (ref 4.0–6.0)

## 2022-12-26 LAB — VITAMIN D 25 TOTAL: VITAMIN D: 76 ng/mL (ref 30–100)

## 2022-12-26 LAB — PROTEIN/CREATININE RATIO, URINE, RANDOM
CREATININE RANDOM URINE: 145 mg/dL — ABNORMAL HIGH (ref 30–125)
PROTEIN RANDOM URINE: 12 mg/dL — ABNORMAL LOW (ref 50–80)
PROTEIN/CREATININE RATIO RANDOM URINE: 0.083 mg/mg (ref 0.000–200.000)

## 2022-12-26 LAB — PARATHYROID HORMONE (PTH): PTH: 16.9 pg/mL (ref 12.0–88.0)

## 2022-12-26 LAB — PHOSPHORUS: PHOSPHORUS: 3.6 mg/dL — ABNORMAL LOW (ref 3.7–7.2)

## 2023-01-02 ENCOUNTER — Other Ambulatory Visit: Payer: Self-pay

## 2023-01-02 ENCOUNTER — Ambulatory Visit (INDEPENDENT_AMBULATORY_CARE_PROVIDER_SITE_OTHER): Payer: Medicare Other | Admitting: Nephrology

## 2023-01-02 ENCOUNTER — Encounter (INDEPENDENT_AMBULATORY_CARE_PROVIDER_SITE_OTHER): Payer: Self-pay | Admitting: Nephrology

## 2023-01-02 VITALS — BP 139/59 | HR 86 | Ht 62.0 in | Wt 147.0 lb

## 2023-01-02 DIAGNOSIS — I129 Hypertensive chronic kidney disease with stage 1 through stage 4 chronic kidney disease, or unspecified chronic kidney disease: Secondary | ICD-10-CM

## 2023-01-02 DIAGNOSIS — N184 Chronic kidney disease, stage 4 (severe): Secondary | ICD-10-CM

## 2023-01-02 DIAGNOSIS — I10 Essential (primary) hypertension: Secondary | ICD-10-CM

## 2023-01-02 DIAGNOSIS — D631 Anemia in chronic kidney disease: Secondary | ICD-10-CM

## 2023-01-02 DIAGNOSIS — E559 Vitamin D deficiency, unspecified: Secondary | ICD-10-CM

## 2023-01-02 DIAGNOSIS — E538 Deficiency of other specified B group vitamins: Secondary | ICD-10-CM

## 2023-01-02 DIAGNOSIS — N261 Atrophy of kidney (terminal): Secondary | ICD-10-CM

## 2023-01-02 NOTE — Progress Notes (Signed)
NEPHROLOGY, [facility]  [address]  [place] [address]       Name: Loretta Crawford MRN:  [id number]   Date of Birth: 1941/02/26 Age: 82 y.o.   Date: 01/02/2023  Time: 14:27       Nephrology Office Note    Reason for visit: Follow Up (F/U  LABS)      History of Present Illness:  Loretta Crawford is a 82 y.o. 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  01-02-2023  Nephrology Clinic follow-up visit on this patient with essential hypertension CKD stage 4 last lab work showed hemoglobin 14.1 creatinine 2.3 GFR 21 mL/minute kidney function is worsening calcium 11.6 anemia chronic kidney disease iron saturation 31 PTH within normal folic acid vitamin D22 vitamin-D within normal hemoglobin A1c 5.6 within normal urine protein ratio within normal kidney ultrasound showed left kidney is atrophic right kidney has a cyst no hydronephrosis blood pressure well controlled continue to drink plenty of fluids avoid NSAIDs keep sugar blood pressure controlled calcium is high discontinue calcium supplements and vitamin-D supplements repeat lab work and see her back in 6 months patient is saying that she has taking a lot of Tums ask her to stop taking Tums      Past Medical History:      Past Surgical History:        Allergies:  No Known Allergies  Medications:  Current Outpatient Medications   Medication Sig    apixaban (ELIQUIS) 5 mg Oral Tablet Take by mouth Twice daily    cholecalciferol (VITAMIN D3) 10 mcg (400 unit) Oral Tablet Take 1 Tablet (400 Units total) by mouth    lisinopriL (PRINIVIL) 20 mg Oral Tablet Take 1 Tablet (20 mg total) by mouth Once a day     pravastatin (PRAVACHOL) 10 mg Oral Tablet      Family History:  Family Medical History:    None         Social History:  Social History     Socioeconomic History    Marital status: Married   Tobacco Use    Smoking status: Never    Smokeless tobacco: Never   Substance and Sexual Activity    Alcohol use: Never    Drug use: Never       Review of Systems:  Constitutional: negative for fevers, chills, sweats, and fatigue  Eyes: negative for visual disturbance, irritation, redness, and icterus  Ears, nose, mouth, throat, and face: negative for hearing loss, ear drainage, nasal congestion, epistaxis  Respiratory: negative for cough, sputum, hemoptysis, wheezing, or dyspnea on exertion  Cardiovascular: negative for chest pain, palpitations, syncope, orthopnea, paroxysmal nocturnal dyspnea, and   Gastrointestinal: negative for dysphagia, nausea, vomiting, melena, diarrhea, constipation, and abdominal pain  Genitourinary:negative for frequency, dysuria, nocturia, urinary incontinence, hesitancy, and hematuria  Integument/breast: negative for rash, skin lesion(s), and pruritus  Hematologic/lymphatic: negative for easy bruising, bleeding, and petechiae  Musculoskeletal:negative for myalgias, arthralgias, neck pain, back pain, no lower extremity edema, and muscle weakness  Neurological: negative for headaches, dizziness, seizures, speech problems, tremor, and weakness  Behavioral/Psych: negative for anxiety, behavior problems, mood swings, and sleep  disturbance  Endocrine: negative for temperature intolerance  Allergic/Immunologic: negative for urticaria and angioedema    Physical Exam:  Vitals:    01/02/23 1349   BP: (!) 139/59   Pulse: 86   Weight: 66.7 kg (147 lb)   Height: 1.575 m (5\' 2" )   BMI: 26.94      Constitutional: Alert and Oriented, no distress,   HEENT : normocephalic , atraumatic vision and hearing grossly normal   Eyes: Conjunctiva clear., Pupils equal and round, reactive to light and accomodation. , Sclera  non-icteric.   ENT: Nose without erythema. , Mouth mucous membranes moist.   Neck: no thyromegaly or lymphadenopathy and supple, symmetrical, trachea midline  Respiratory: Clear to auscultation bilaterally. No wheezes, No rales, Good air exchange bilaterally  Cardiovascular: regular rate and rhythm no murmurs no rub  Gastrointestinal: Soft, non-tender, Bowel sounds normal, No hepatosplenomegaly  Genitourinary: no suprapubic tenderness  Lower Extremities: No edema, Pulses +4  Neurologic: Grossly normal. Speech clear.   Psychiatric: Normal mood and affect     Lab:  Lab Results   Component Value Date    BUN 45 (H) 12/26/2022    CREATININE 2.32 (H) 12/26/2022    BUNCRRATIO 19 12/26/2022    GFR 21 (L) 12/26/2022    SODIUM 135 (L) 12/26/2022    POTASSIUM 4.7 12/26/2022    CHLORIDE 102 12/26/2022    CO2 25 12/26/2022    ANIONGAP 8 12/26/2022    CALCIUM 11.6 (H) 12/26/2022    PHOSPHORUS 3.6 (L) 12/26/2022    ALBUMIN 4.2 12/26/2022    HGB 14.1 12/26/2022    HCT 42.7 (H) 12/26/2022    INTACTPTH 16.9 12/26/2022    IRON 102 12/26/2022    IRONBINDCAP 329 12/26/2022    IRONSAT 31 12/26/2022       Lab Results   Component Value Date    HA1C 5.6 12/26/2022    URICACID 6.7 12/26/2022        Assessment and Plan:  ENCOUNTER DIAGNOSES     ICD-10-CM   1. CKD (chronic kidney disease) stage 4, GFR 15-29 ml/min (CMS HCC)  N18.4   2. Anemia in stage 4 chronic kidney disease (CMS HCC)   N18.4    D63.1   3. Vitamin D deficiency  E55.9   4. Vitamin B12 deficiency  E53.8   5. Essential (primary) hypertension  I10   6. Hypercalcemia  E83.52        Plan:  CKD stage 4 kidney function a little worse drink plenty of fluids avoid NSAIDs keep sugar blood pressure controlled she has a atrophic left kidney so she has 1 kidney functioning  Repeat lab work see her back in 6 months  Anemia chronic kidney disease hemoglobin back to normal anemia profile within normal  Vitamin-D deficiency with high calcium will discontinue vitamin-D  supplements  Hypercalcemia avoid any calcium supplements PTH within normal repeat lab in 6 months  Essential hypertension keep blood pressure controlled                   No follow-ups on file.    Richardean Sale, MD     Portions of this note may be dictated using voice recognition software or a dictation service. Variances in spelling and vocabulary are possible and unintentional. Not all errors are caught/corrected. Please notify the [facility] if any discrepancies are noted or if the meaning of any statement is not clear. ,b

## 2023-04-30 ENCOUNTER — Emergency Department
Admission: EM | Admit: 2023-04-30 | Discharge: 2023-04-30 | Disposition: A | Discharge status: Home / Self Care | Payer: Medicare Other | Attending: Family Medicine | Admitting: Family Medicine

## 2023-04-30 ENCOUNTER — Encounter (HOSPITAL_COMMUNITY): Payer: Self-pay | Admitting: Family Medicine

## 2023-04-30 ENCOUNTER — Emergency Department (HOSPITAL_COMMUNITY): Payer: Medicare Other

## 2023-04-30 ENCOUNTER — Other Ambulatory Visit: Payer: Self-pay

## 2023-04-30 DIAGNOSIS — M48061 Spinal stenosis, lumbar region without neurogenic claudication: Secondary | ICD-10-CM | POA: Insufficient documentation

## 2023-04-30 DIAGNOSIS — M5136 Other intervertebral disc degeneration, lumbar region: Secondary | ICD-10-CM | POA: Insufficient documentation

## 2023-04-30 DIAGNOSIS — M5134 Other intervertebral disc degeneration, thoracic region: Secondary | ICD-10-CM | POA: Insufficient documentation

## 2023-04-30 DIAGNOSIS — G8929 Other chronic pain: Secondary | ICD-10-CM

## 2023-04-30 DIAGNOSIS — M47816 Spondylosis without myelopathy or radiculopathy, lumbar region: Secondary | ICD-10-CM | POA: Insufficient documentation

## 2023-04-30 DIAGNOSIS — M47812 Spondylosis without myelopathy or radiculopathy, cervical region: Secondary | ICD-10-CM | POA: Insufficient documentation

## 2023-04-30 MED ORDER — DEXAMETHASONE SODIUM PHOSPHATE 4 MG/ML INJECTION SOLUTION
4.0000 mg | INTRAMUSCULAR | Status: AC
Start: 2023-04-30 — End: 2023-04-30
  Administered 2023-04-30: 4 mg via INTRAMUSCULAR

## 2023-04-30 MED ORDER — DEXAMETHASONE SODIUM PHOSPHATE 4 MG/ML INJECTION SOLUTION
INTRAMUSCULAR | Status: AC
Start: 2023-04-30 — End: 2023-04-30
  Filled 2023-04-30: qty 1

## 2023-04-30 NOTE — ED Nurses Note (Signed)
Discharged home, received written and verbal discharge instructions by provider. Leaving via wheelchair with family.

## 2023-04-30 NOTE — ED Provider Notes (Signed)
[facility]  ED Primary Provider Note  Patient Name: Loretta Crawford  Patient Age: 82 y.o.  Date of Birth: [birth year]    Chief Complaint: Back Pain        History of Present Illness       Loretta Crawford is a 82 y.o. female who had concerns including Back Pain.  PATIENT PRESENTED TO THE EMERGENCY DEPARTMENT WITH COMPLAINTS OF CHRONIC BACK PAIN THAT HAS WORSENED OVER THE LAST SEVERAL MONTHS.  PATIENT DOES ADMIT TO A COUPLE OF FALLS A FEW MONTHS AGO, BUT NO RECENT FALLS.  PATIENT COMPLAINS OF PAIN THROUGHOUT HER BACK AND STATES THAT SHE HAS A LONG HISTORY OF OSTEOARTHRITIS.  PATIENT DOES TAKE TRAMADOL WITH SOME IMPROVEMENT IN SYMPTOMS.  SHE DENIES ANY SIGNIFICANT RADIATION OF PAIN INTO THE LOWER EXTREMITIES.  SHE DENIES ANY NUMBNESS OR TINGLING, SADDLE ANESTHESIA, RECENT CHANGES IN BOWEL OR BLADDER HABITS.  PATIENT DENIES ANY FEVERS/CHILLS, CHEST PAIN OR SHORTNESS OF BREATH.  PATIENT PRESENTED TO THE EMERGENCY DEPARTMENT FOR IMAGING, INCLUDING MRI TO SEE WHAT IS WRONG WITH HER BACK.  PATIENT WAS IN NO APPARENT DISTRESS AND DENIES ANY FURTHER COMPLAINTS.  FAMILY MEMBER DENIES ANY FURTHER CONCERNS.        Review of Systems     No other overt Review of Systems are noted to be positive except noted in the HPI.      Historical Data   History Reviewed This Encounter: Medical History  Surgical History  Family History  Social History      Physical Exam   ED Triage Vitals [04/30/23 1756]   BP (Non-Invasive) (!) 133/57   Heart Rate 86   Respiratory Rate 20   Temperature 36.3 C (97.3 F)   SpO2 96 %   Weight 66.2 kg (146 lb)   Height 1.615 m (5' 3.6")         Nursing notes reviewed for what could be assessed. Past Medical, Surgical, and Social history reviewed for what has been completed.     Constitutional: NAD. Well-Developed. Well Nourished.  AFEBRILE  Head: Normocephalic, atraumatic.  Mouth/Throat: no nasal discharge  Eyes: EOM grossly intact, conjunctiva normal.  Cardiovascular: Regular Rate  and Rhythm, extremities well perfused.  Pulmonary/Chest: No respiratory distress. Lungs are symmetric to auscultation bilaterally.  Abdominal: Soft, non-tender, non-distended.   MSK: No Lower Extremity Edema.  NO TENDERNESS TO PALPATION.  PEDAL PULSES INTACT.  MILD TENDERNESS TO PALPATION OF THE CERVICAL, THORACIC AND LUMBAR SPINE.  NO OBVIOUS STEP-OFF DEFORMITY APPRECIATED ON EXAMINATION.  NO DECREASED RANGE OF MOTION.  Skin: Warm, dry, and intact  Neuro: Appropriate, CN II-XII grossly intact NO MOTOR OR SENSORY DEFICITS NOTED ON EXAMINATION.  Psych: Cooperative           Procedures      Patient Data   Labs Ordered/Reviewed - No data to display    CT LUMBAR SPINE WO IV CONTRAST   Final Result by Edi, Radresults In (07/03 2044)   NO ACUTE LUMBAR FRACTURE.  DEGENERATIVE CHANGES.         One or more dose reduction techniques were used (e.g., Automated exposure control, adjustment of the mA and/or kV according to patient size, use of iterative reconstruction technique).         Radiologist location ID: [id number]         CT THORACIC SPINE WO IV CONTRAST   Final Result by Edi, Radresults In (07/03 2038)   NO ACUTE THORACIC FRACTURE.  DEGENERATIVE CHANGES.  One or more dose reduction techniques were used (e.g., Automated exposure control, adjustment of the mA and/or kV according to patient size, use of iterative reconstruction technique).         Radiologist location ID: [id number]         CT CERVICAL SPINE WO IV CONTRAST   Final Result by Edi, Radresults In (07/03 2025)   NO ACUTE CERVICAL FRACTURE.  DEGENERATIVE CHANGES.         One or more dose reduction techniques were used (e.g., Automated exposure control, adjustment of the mA and/or kV according to patient size, use of iterative reconstruction technique).         Radiologist location ID: [id number]             Medical Decision Making          Medical Decision Making        Studies Assessed:  IMAGING        MDM Narrative:  PATIENT PRESENTED TO THE  EMERGENCY DEPARTMENT WITH COMPLAINTS OF CHRONIC BACK PAIN THAT HAS WORSENED OVER THE LAST SEVERAL MONTHS.  PATIENT DOES ADMIT TO A COUPLE OF FALLS A FEW MONTHS AGO, BUT NO RECENT FALLS.  PATIENT COMPLAINS OF PAIN THROUGHOUT HER BACK AND STATES THAT SHE HAS A LONG HISTORY OF OSTEOARTHRITIS.  PATIENT DOES TAKE TRAMADOL WITH SOME IMPROVEMENT IN SYMPTOMS.  SHE DENIES ANY SIGNIFICANT RADIATION OF PAIN INTO THE LOWER EXTREMITIES.  SHE DENIES ANY NUMBNESS OR TINGLING, SADDLE ANESTHESIA, RECENT CHANGES IN BOWEL OR BLADDER HABITS.  PATIENT DENIES ANY FEVERS/CHILLS, CHEST PAIN OR SHORTNESS OF BREATH.  PATIENT PRESENTED TO THE EMERGENCY DEPARTMENT FOR IMAGING, INCLUDING MRI TO SEE WHAT IS WRONG WITH HER BACK.  PATIENT WAS IN NO APPARENT DISTRESS AND DENIES ANY FURTHER COMPLAINTS.  FAMILY MEMBER DENIES ANY FURTHER CONCERNS.  PATIENT HAS BEEN USING TRAMADOL AT HOME WITH SOME IMPROVEMENT IN SYMPTOMS.  PHYSICAL EXAMINATION REVEALED TENDERNESS TO PALPATION OF THE MIDTHORACIC THROUGH THE LUMBAR SPINE.  THERE WAS NO OBVIOUS STEP-OFF DEFORMITY APPRECIATED ON EXAMINATION.  NO DECREASED RANGE OF MOTION.  HEART AND LUNG EXAMINATION WITHIN NORMAL LIMITS.  PEDAL PULSES INTACT BILATERALLY.  LOWER EXTREMITIES WERE WARM WITH NO DISCOLORATION OR CONCERN FOR POOR BLOOD FLOW AT THIS TIME.  NO LOSS OF SENSATION IN THE LOWER EXTREMITIES OR TENDERNESS TO PALPATION.  CT IMAGING WAS ORDERED.  PATIENT WAS STABLE.      ED Course as of 04/30/23 2238   Wed Apr 30, 2023   2025 CT IMAGING OF THE CERVICAL SPINE REVEALED:   FINDINGS:  Alignment: Normal     Vertebrae: No acute fracture     Moderate multilevel canal narrowing. There is also foraminal narrowing at C3-4 through C6-7.     Soft Tissues:   No large prevertebral hematoma        IMPRESSION:  NO ACUTE CERVICAL FRACTURE.  DEGENERATIVE CHANGES.   2042 CT IMAGING OF THE THORACIC SPINE REVEALED:   FINDINGS:  Alignment: Normal.     Bones: Normal thoracic vertebral heights.  No acute fracture.  Multilevel  degenerative disc disease.  No suspicious lytic or blastic lesion.     Soft Tissues:   Unremarkable paraspinal tissues.     Other: Visualized portions of the lungs are unremarkable.           IMPRESSION:  NO ACUTE THORACIC FRACTURE.  DEGENERATIVE CHANGES.   2045 CT IMAGING OF THE LUMBAR SPINE REVEALED:   FINDINGS:  Vertebrae:  Normal lumbar vertebral body heights.  No evidence of  fracture.  No spondylolysis.     Alignment:  No spondylolisthesis.     Moderate to severe canal narrowing at L2-3 and L3-4.     Osteophytes contribute to foraminal narrowing at L4-5 and L5-S1 on the left.] L2-3 and L3-4 foraminal narrowing is noted.      Sacrum:  Visualized upper sacrum and SI joints are unremarkable.     Visualized retroperitoneal structures are unremarkable.        IMPRESSION:  NO ACUTE LUMBAR FRACTURE.  DEGENERATIVE CHANGES.     ON REEXAMINATION, PATIENT WAS RESTING COMFORTABLY AND IN NO APPARENT DISTRESS.  SHE WAS ABLE TO MOBILIZE WITHOUT DIFFICULTY AND WITHOUT SIGNIFICANT PAIN.  DEXAMETHASONE WAS ORDERED AND ADMINISTERED.  PATIENT WAS COUNSELED AND EDUCATED ON SUPPORTIVE CARE AT HOME, INCLUDING TAKING HER TRAMADOL, AS PRESCRIBED.  PATIENT JUST FILLED THE PRESCRIPTION WITHIN THE LAST COUPLE OF DAYS.  PATIENT WAS PROVIDED WITH A REFERRAL FOR INTERVENTIONAL RADIOLOGY, BUT FAMILY MEMBERS STATES THAT THEY WILL LIKELY FOLLOW UP WITH HER BACK DOCTOR.  PATIENT INSTRUCTED TO FOLLOW UP WITH PCP IN THE NEXT 1-2 DAYS TO RECHECK SYMPTOMS AND WAS GIVEN VERY CLEAR INSTRUCTIONS TO RETURN TO THE EMERGENCY DEPARTMENT FOR ANY NEW OR WORSENING SYMPTOMS.  PATIENT VERBALIZED UNDERSTANDING.  PATIENT WAS SMILING AND STABLE AT TIME OF DISCHARGE.  ALL QUESTIONS WERE ANSWERED TO SATISFACTION.    Medications Administered in the ED   dexAMETHasone 4 mg/mL injection (4 mg IntraMUSCULAR Given 04/30/23 2128)       Following the history, physical exam, and ED workup, the patient was deemed stable and suitable for discharge. The patient/caregiver was  advised to return to the ED for any new or worsening symptoms. Discharge medications, and follow-up instructions were discussed with the patient/caregiver in detail, who verbalizes understanding. The patient/caregiver is in agreement and is comfortable with the plan of care.    Disposition: Discharged         Current Discharge Medication List        CONTINUE these medications - NO CHANGES were made during your visit.        Details   apixaban 5 mg Tablet  Commonly known as: ELIQUIS   Oral, 2 TIMES DAILY  Refills: 0     lisinopriL 20 mg Tablet  Commonly known as: PRINIVIL   20 mg, Oral, DAILY  Refills: 0     pravastatin 10 mg Tablet  Commonly known as: PRAVACHOL   Refills: 0            Follow up:   Mliss Sax, DO  [address] [facility]  [place] [address]  [phone]    In 1 day      [facility]  [address]  [place] [address]  [phone]    As needed, If symptoms worsen    Neomia Dear, MD  [address]  [place] [address]  [phone]    In 3 days                 Clinical Impression   Degenerative disc disease, lumbar (Primary)   Degenerative disc disease, thoracic   Spinal stenosis, lumbar   Chronic midline low back pain without sciatica         Discharge Medication List as of 04/30/2023  9:13 PM            Tawanna Sat, DO

## 2023-04-30 NOTE — ED Triage Notes (Signed)
Chronic back pain request xray, ultrasound , and mri states got  to  find out why she having this pain . Pain is chronic worse last two weeks

## 2023-06-20 IMAGING — MR MRI LUMBAR SPINE WITHOUT CONTRAST
5 of 6 series · 32 of 48 positions shown · non-contrast
Comparison: None available.

﻿EXAM:  66328   MRI LUMBAR SPINE WITHOUT CONTRAST
INDICATION: Arthritis, spine spurs, degenerative scoliosis.
TECHNIQUE: Noncontrast multiplanar, multisequence MRI was performed.

[Series 5: T2 · sagittal · 4.5mm · 0.94mm/px · 7 of 13 slices shown (1 of 3)]
[im 1/13]
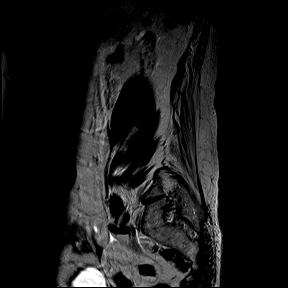
[im 3/13]
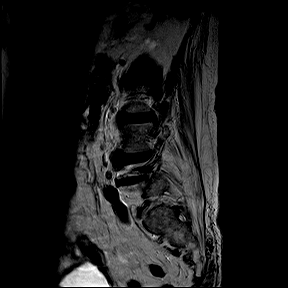
[im 5/13]
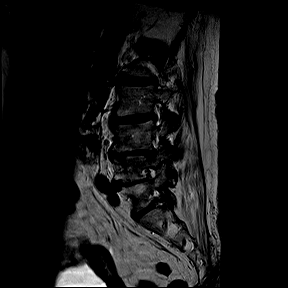
[im 7/13]
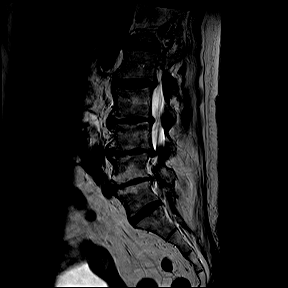
[im 9/13]
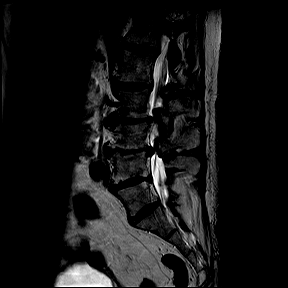
[im 11/13]
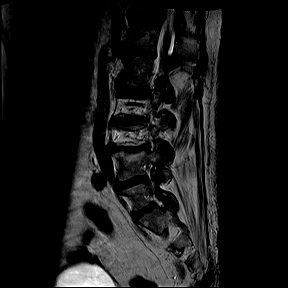
[im 13/13]
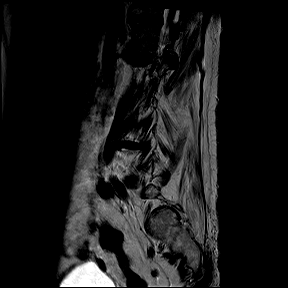

[Series 6: T1 · sagittal · 4.5mm · 0.94mm/px · 6 of 13 slices shown (1 of 2)]
[im 1/13]
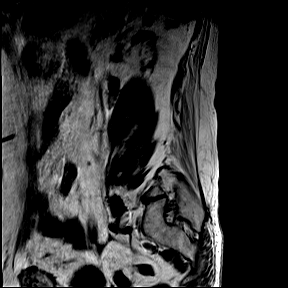
[im 3/13]
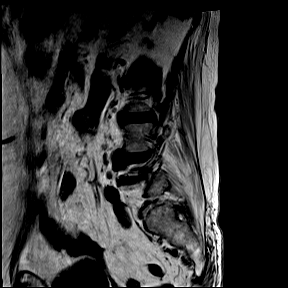
[im 5/13]
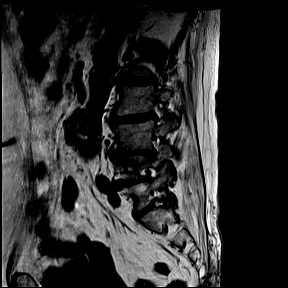
[im 8/13]
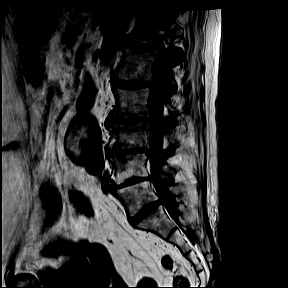
[im 10/13]
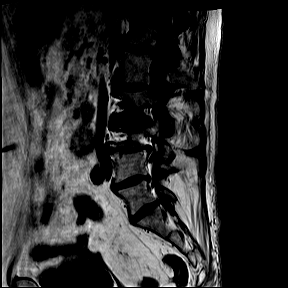
[im 13/13]
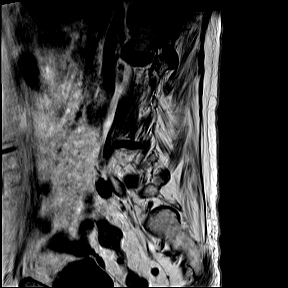

[Series 8: T2 · coronal · 5.0mm · 0.82mm/px · 9 of 18 slices shown (2 of 3)]
[im 1/18]
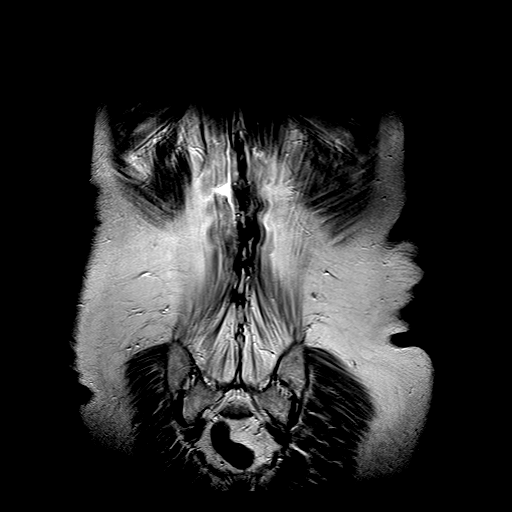
[im 3/18]
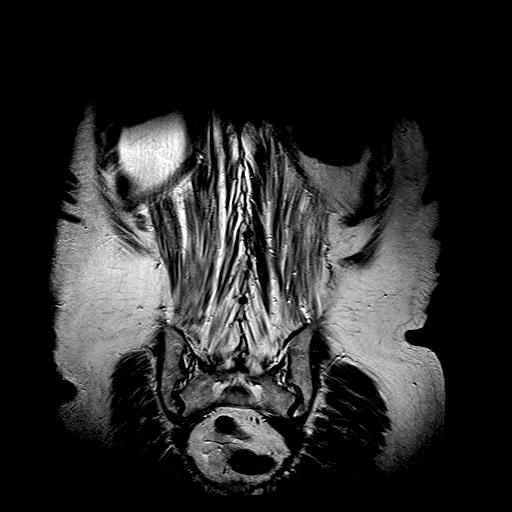
[im 5/18]
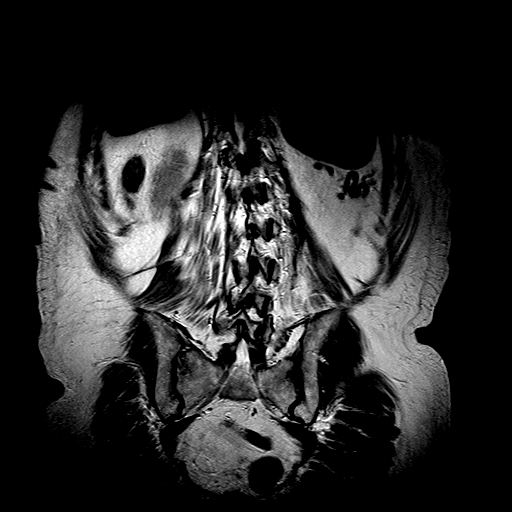
[im 7/18]
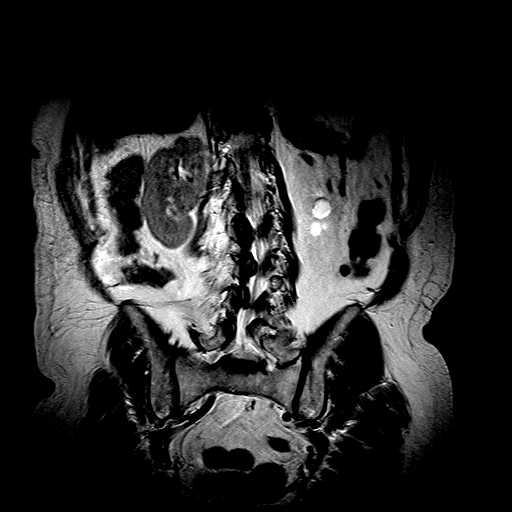
[im 9/18]
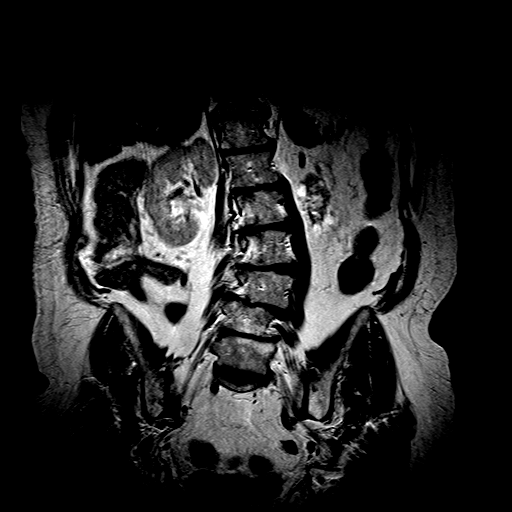
[im 11/18]
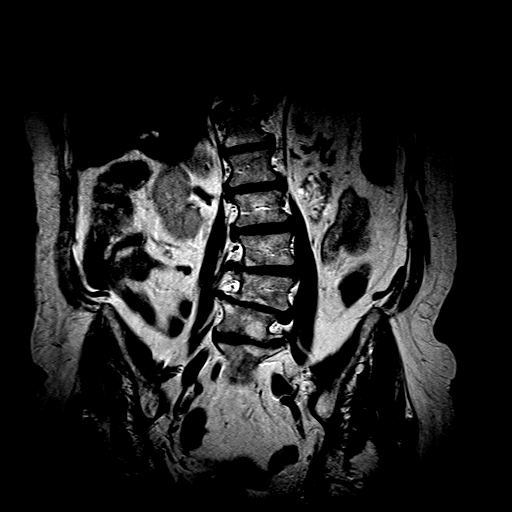
[im 13/18]
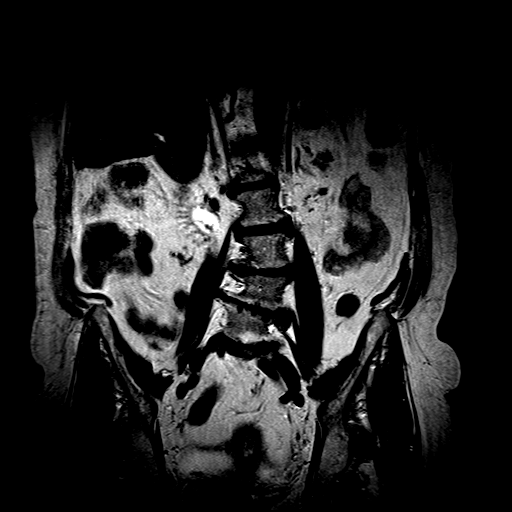
[im 15/18]
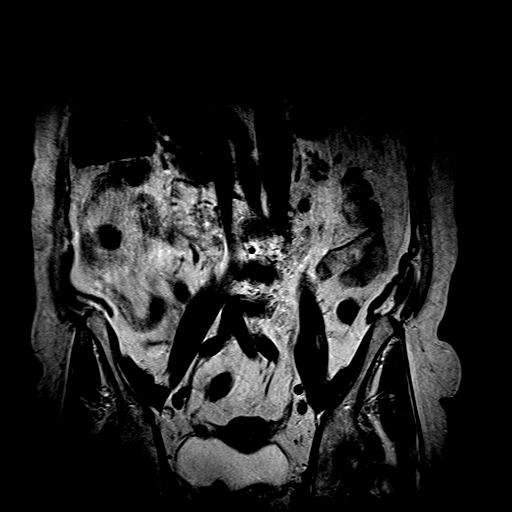
[im 18/18]
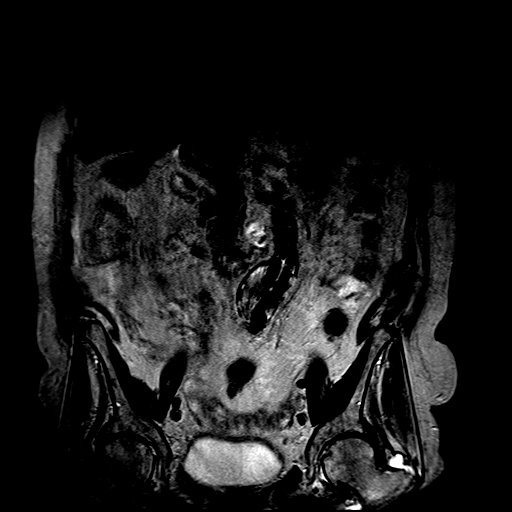

[Series 9: T2 · axial · 4.0mm · 0.52mm/px · z∈[-47,+159]mm · 8 of 20 slices shown (3 of 3)]
[im 1/20]
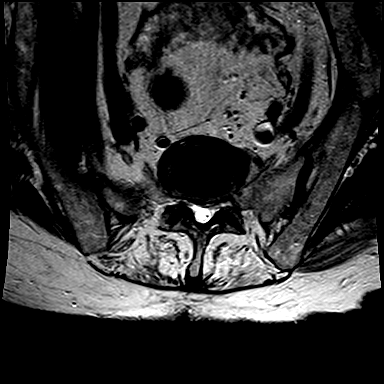
[im 3/20]
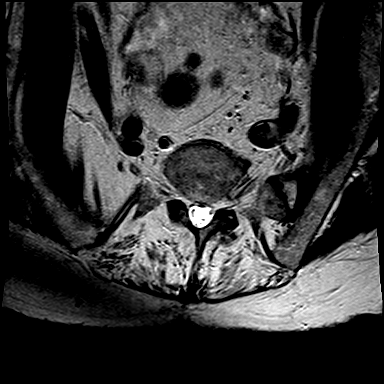
[im 7/20]
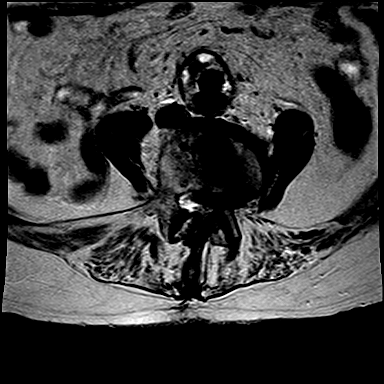
[im 9/20]
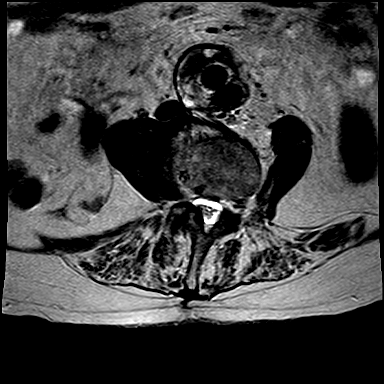
[im 11/20]
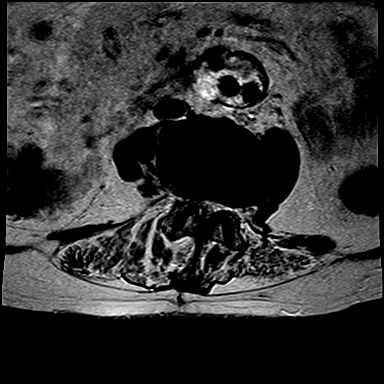
[im 13/20]
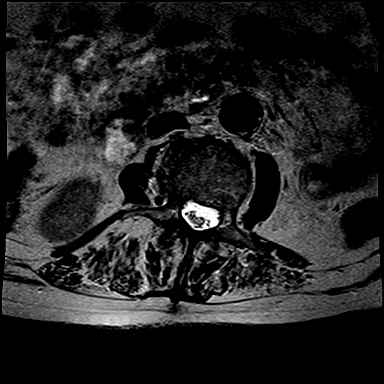
[im 17/20]
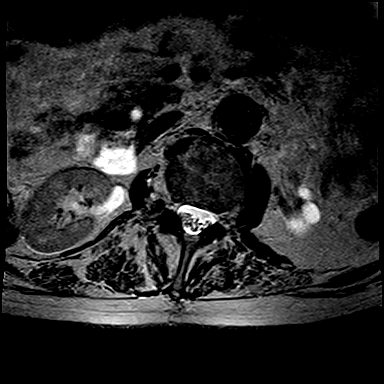
[im 20/20]
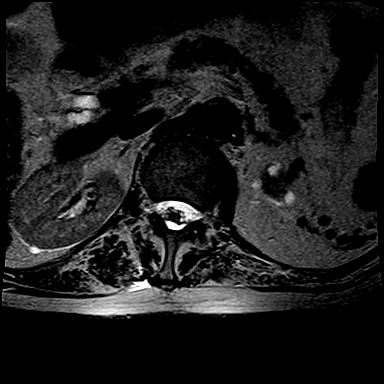

[Series 10: T1 · axial · 4.0mm · 0.52mm/px · z∈[-47,-39]mm · 2 of 20 slices shown (2 of 2)]
[im 1/20]
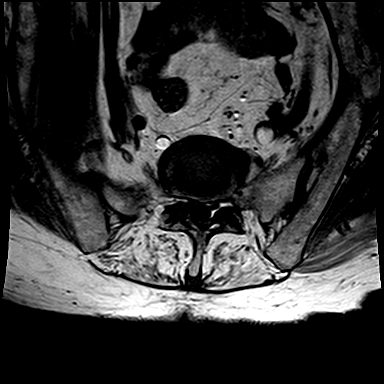
[im 3/20]
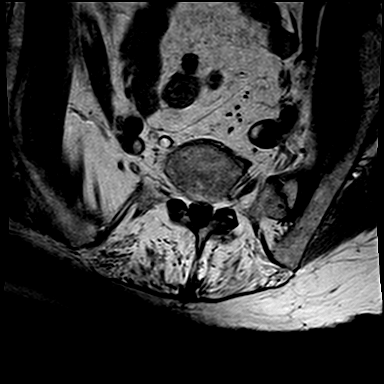

[32 of 48 positions shown; findings below may reference images not displayed]

FINDINGS: There is severe left renal atrophy.  There is moderate levoconvex scoliosis.  

There are moderately severe degenerative changes at multiple levels.  There is no fracture or significant malalignment.  The conus appears unremarkable.  

At T12-L1, there is mild disc bulging. 

At L1-L2, there is mild disc bulging and mild spinal stenosis.  

At L2-L3, there is moderate disc bulging and moderately severe spinal stenosis.  

At L3-L4, there is moderate disc bulging and moderately severe spinal stenosis.  

At L4-L5, there is mild disc bulging and mild spinal stenosis.  

L5-S1 appears unremarkable.
IMPRESSION: 1. Moderately severe degenerative changes.

2. Multilevel spinal stenosis as described above.

## 2023-06-27 ENCOUNTER — Other Ambulatory Visit (INDEPENDENT_AMBULATORY_CARE_PROVIDER_SITE_OTHER): Payer: Self-pay

## 2023-06-27 DIAGNOSIS — E559 Vitamin D deficiency, unspecified: Secondary | ICD-10-CM

## 2023-06-27 DIAGNOSIS — N184 Chronic kidney disease, stage 4 (severe): Secondary | ICD-10-CM

## 2023-07-03 ENCOUNTER — Encounter (INDEPENDENT_AMBULATORY_CARE_PROVIDER_SITE_OTHER): Payer: Medicare Other | Admitting: Nephrology

## 2023-07-04 ENCOUNTER — Encounter (INDEPENDENT_AMBULATORY_CARE_PROVIDER_SITE_OTHER): Payer: Self-pay | Admitting: Nephrology

## 2023-08-05 ENCOUNTER — Encounter (INDEPENDENT_AMBULATORY_CARE_PROVIDER_SITE_OTHER): Payer: Medicare Other | Admitting: Nephrology

## 2023-08-18 ENCOUNTER — Other Ambulatory Visit: Payer: Self-pay

## 2023-08-18 ENCOUNTER — Encounter (INDEPENDENT_AMBULATORY_CARE_PROVIDER_SITE_OTHER): Payer: Self-pay | Admitting: Nephrology

## 2023-08-18 ENCOUNTER — Ambulatory Visit (INDEPENDENT_AMBULATORY_CARE_PROVIDER_SITE_OTHER): Payer: Medicare Other | Admitting: Nephrology

## 2023-08-18 VITALS — BP 122/61 | HR 77 | Ht 62.0 in | Wt 152.0 lb

## 2023-08-18 DIAGNOSIS — N261 Atrophy of kidney (terminal): Secondary | ICD-10-CM

## 2023-08-18 DIAGNOSIS — I129 Hypertensive chronic kidney disease with stage 1 through stage 4 chronic kidney disease, or unspecified chronic kidney disease: Secondary | ICD-10-CM

## 2023-08-18 DIAGNOSIS — N1832 Chronic kidney disease, stage 3b (CMS HCC): Secondary | ICD-10-CM

## 2023-08-18 DIAGNOSIS — E559 Vitamin D deficiency, unspecified: Secondary | ICD-10-CM

## 2023-08-18 DIAGNOSIS — D631 Anemia in chronic kidney disease: Secondary | ICD-10-CM

## 2023-08-18 DIAGNOSIS — I2510 Atherosclerotic heart disease of native coronary artery without angina pectoris: Secondary | ICD-10-CM

## 2023-08-18 DIAGNOSIS — N184 Chronic kidney disease, stage 4 (severe): Secondary | ICD-10-CM

## 2023-08-18 NOTE — Progress Notes (Signed)
[facility]  [address] [place] [address]    Progress Note    Name: Loretta Crawford MRN:  [id number]   Date: 08/18/2023 DOB:  [birth year] (82 y.o.)              Nephrology Follow Up Visit          HPI: 82 y.o. female very pleasant with past medical history of chronic kidney disease stage 4, patient is here for follow-up.  Patient has previous baseline around 2.3 GFR 21.  Patient is feeling well today.  Husband present with her during the encounter.  Discussed with him the findings of the lab work.  Patient denies dysuria.  Denies blood in the urine.  Patient reports she drinks about 28 oz of fluids a day we discussed increasing fluid intake.    Lab work on 08/11/2023 creatinine 1.62 GFR 32 carbon dioxide 24 albumin 4.1 albumin to creatinine ratio not significant A1c 5.8 TSH 1.2 vitamin-D 91.2 uric acid 6.3 phosphorus 3.9 PTH 29, calcium 10.2 with a normal albumin 4.1.        ROS:         Systematic review of 12 organ systems was negative except what mentioned in in the HPI.      OBJECTIVE:   BP 122/61   Pulse 77   Ht 1.575 m (5\' 2" )   Wt 68.9 kg (152 lb)   BMI 27.80 kg/m       General:  NAD, AAOx3  HEENT:  EOMI, MMM, no pallor, no icterus  NECK: No increased JVD.    HEART: Normal S1 and S2. Regular rhythm. No murmurs or rubs.   LUNGS: Clear to auscultation bilateral. No wheezes, rales, or rhonchi.   ABDOMEN: +BS, Soft, nontender and nondistended. No rebound or guarding present.   EXTREMITIES: No edema. No asterixis          LABORATORY DATA:   Lab Results   Component Value Date    BUN 45 (H) 12/26/2022    CREATININE 2.32 (H) 12/26/2022    BUNCRRATIO 19 12/26/2022    GFR 21 (L) 12/26/2022    SODIUM 135 (L) 12/26/2022    POTASSIUM 4.7 12/26/2022    CHLORIDE 102 12/26/2022    CO2 25 12/26/2022    ANIONGAP 8 12/26/2022    CALCIUM 11.6 (H) 12/26/2022    PHOSPHORUS 3.6 (L) 12/26/2022    ALBUMIN 4.2 12/26/2022    HGB 14.1 12/26/2022    HCT 42.7 (H) 12/26/2022    INTACTPTH 16.9 12/26/2022     IRON 102 12/26/2022    IRONBINDCAP 329 12/26/2022    IRONSAT 31 12/26/2022       Lab Results   Component Value Date    HA1C 5.6 12/26/2022    URICACID 6.7 12/26/2022          MEDICATIONS:  No outpatient medications have been marked as taking for the 08/18/23 encounter (Office Visit) with Rhina Brackett, MD.     Current Outpatient Medications   Medication Instructions    apixaban (ELIQUIS) 5 mg Oral Tablet Oral, 2 TIMES DAILY    lisinopriL (PRINIVIL) 20 mg, Oral, DAILY    pravastatin (PRAVACHOL) 10 mg Oral Tablet          ASSESSMENT / PLAN:   ENCOUNTER DIAGNOSES     ICD-10-CM   1. 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 Vitamin D deficiency  E55.9  Chronic kidney disease  -Stage 4  -Due to atrophic left kidney , atherosclerotic vascular disease and aging  -Creatinine is better 1.62 on 08/11/2023  Lab Results   Component Value Date    CREATININE 2.32 (H) 12/26/2022        -Baseline creatinine 2.3/GFR 21  -Total protein to creatinine ratio-  Lab Results   Component Value Date    UPCR 0.083 12/26/2022      -UACR  -CBC and a basic metabolic panel  -Return to clinic in 5 months  -Continue low-sodium diet  -balanced Fluid intake 40-50 oz a day.    -Avoid NSAIDs.  Tylenol only for pain  -Renal imaging severe atrophic left kidney  -ACEI or ARBS:  yes  -Sodium Glucose Cotransporter-2 (SGLT2) Inhibitors:  No    CKD bone mineral disease  Lab Results   Component Value Date    INTACTPTH 16.9 12/26/2022    PHOSPHORUS 3.6 (L) 12/26/2022   -hypercalcemia resolved      Hypertension  -Blood pressure is at goal  -Goal less than 130/80  -Continue lisinopril  -Low-salt diet          Anemia of CKD   - HB   Lab Results   Component Value Date    HGB 14.1 12/26/2022              Orders Placed This Encounter    BASIC METABOLIC PANEL    CBC/DIFF    MAGNESIUM    MICROALBUMIN/CREATININE RATIO, URINE, RANDOM    PARATHYROID HORMONE (PTH)    PROTEIN/CREATININE RATIO, URINE, RANDOM    URIC ACID    VITAMIN D 25 TOTAL

## 2023-09-29 IMAGING — MG 3D SCREENING MAMMO BIL AND TOMO
3 series · 8 of 24 positions shown · non-contrast
Comparison: 04/18/2023, 02/13/2022.

------------- REPORT [id number] -------------
﻿

EXAM:  3D SCREENING MAMMO BIL AND TOMO
INDICATION: Screening mammogram.  Asymptomatic 82-year-old with family history in her sister.  Lifetime breast cancer risk 3.4%.

[R CC tomo · right · 0.10mm/px · 4 of 4 slices shown]
[im 1/4]
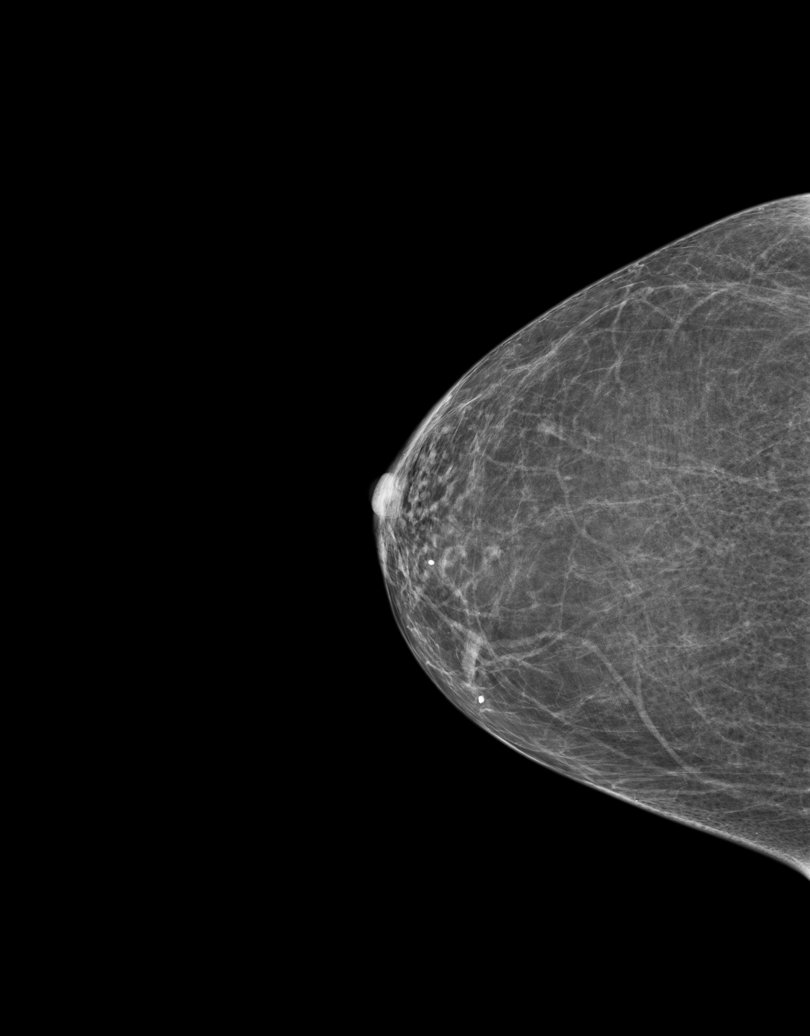
[im 2/4]
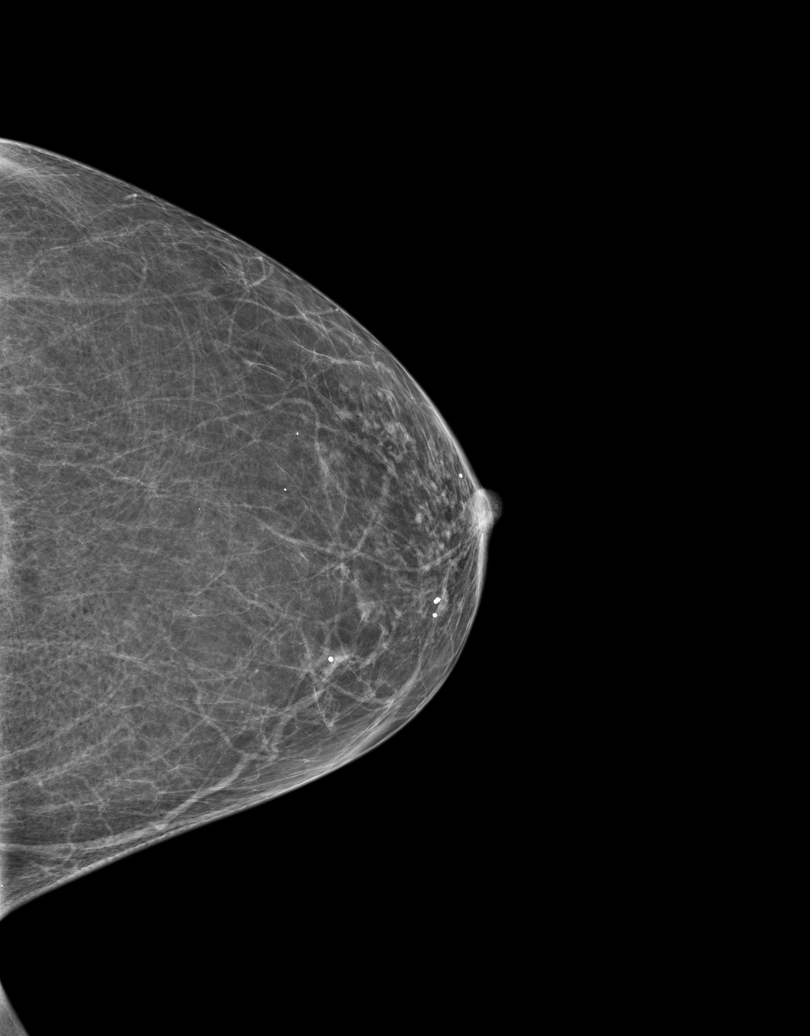
[im 3/4]
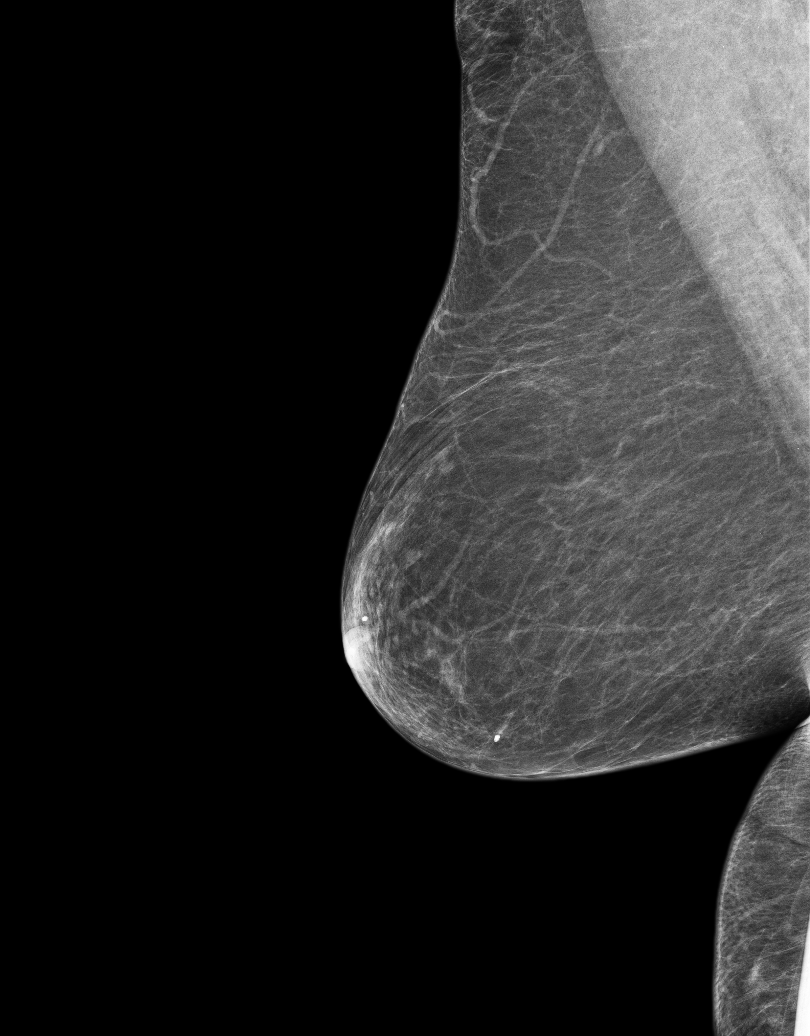
[im 4/4]
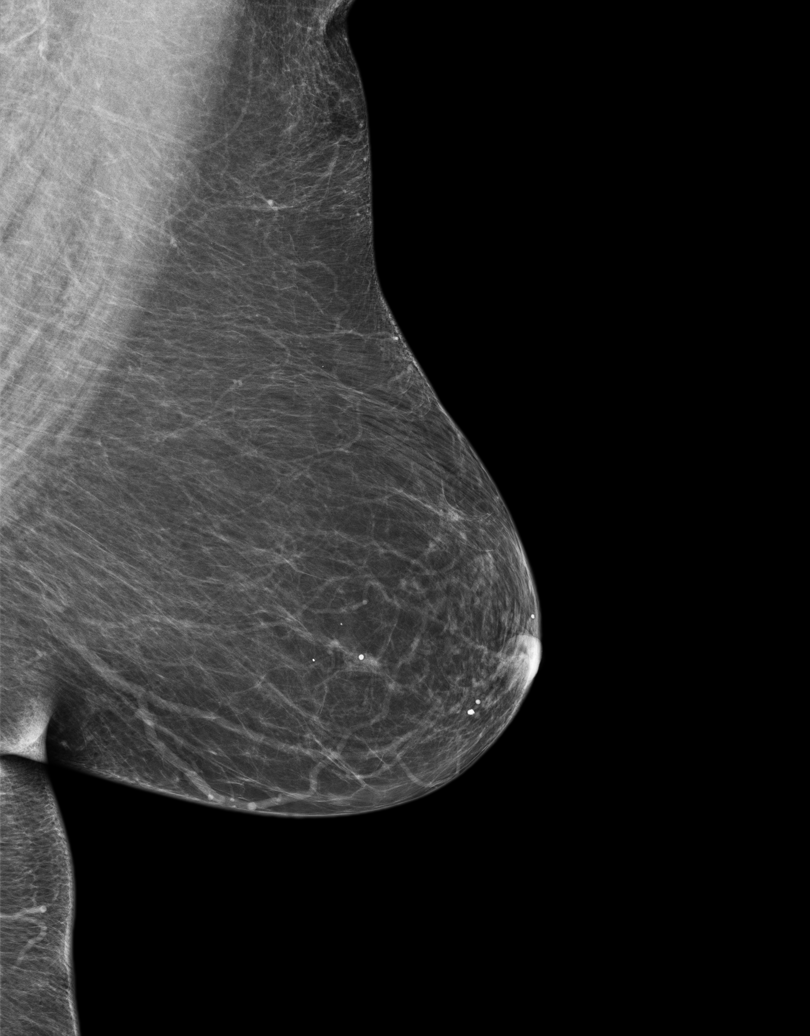

[3D SCREENING MAMMO BIL AND TOMO tomo · 2 acquisitions, 3 frames shown (1 of 2)]
[im 1/2]
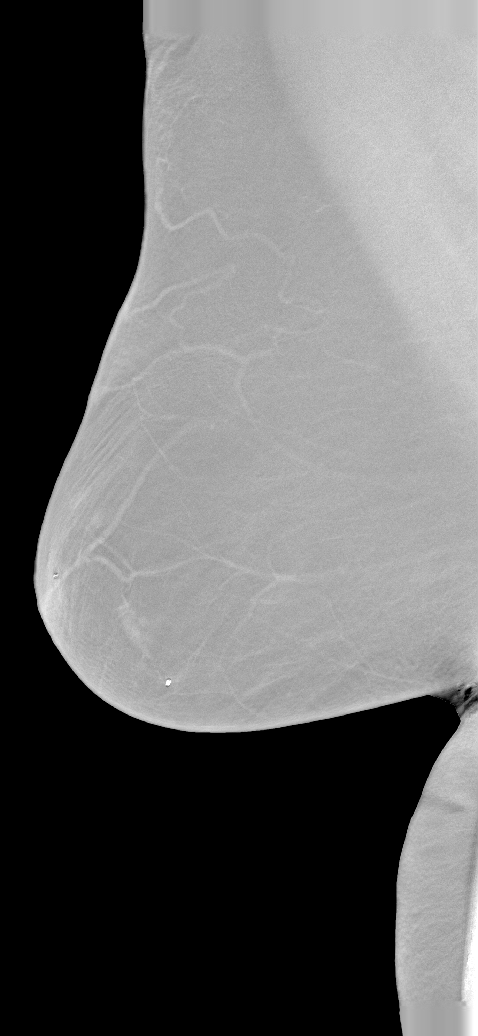
[im 2/2]
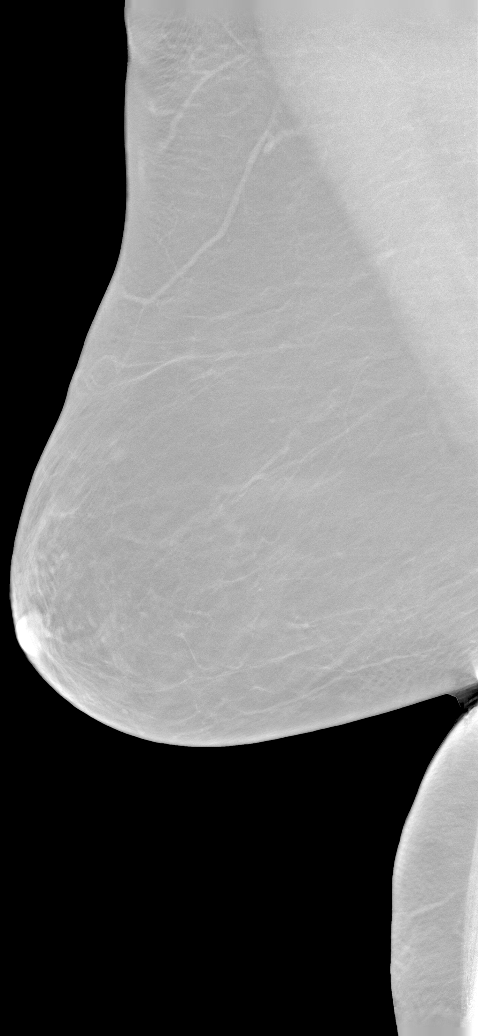
[im 2/2]
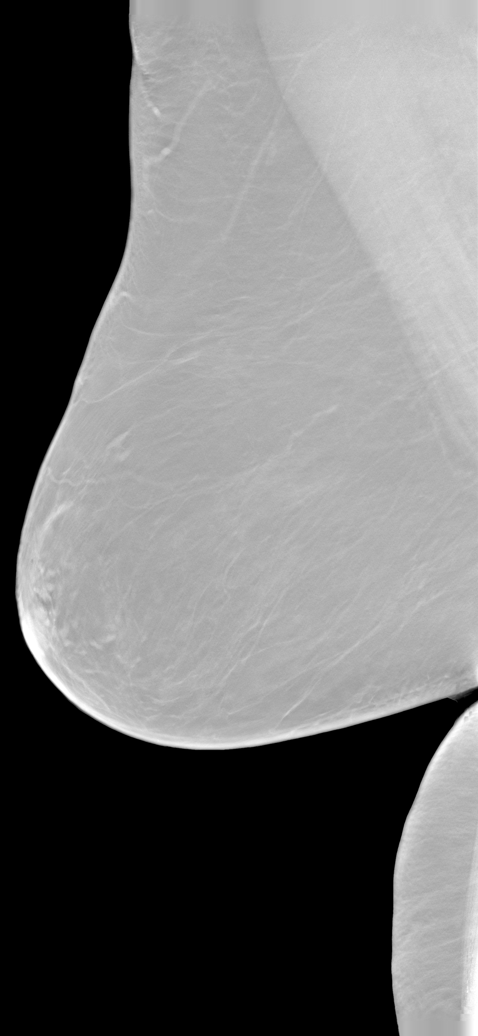

[3D SCREENING MAMMO BIL AND TOMO tomo (2 of 2) · tomo slice 9/53.0]
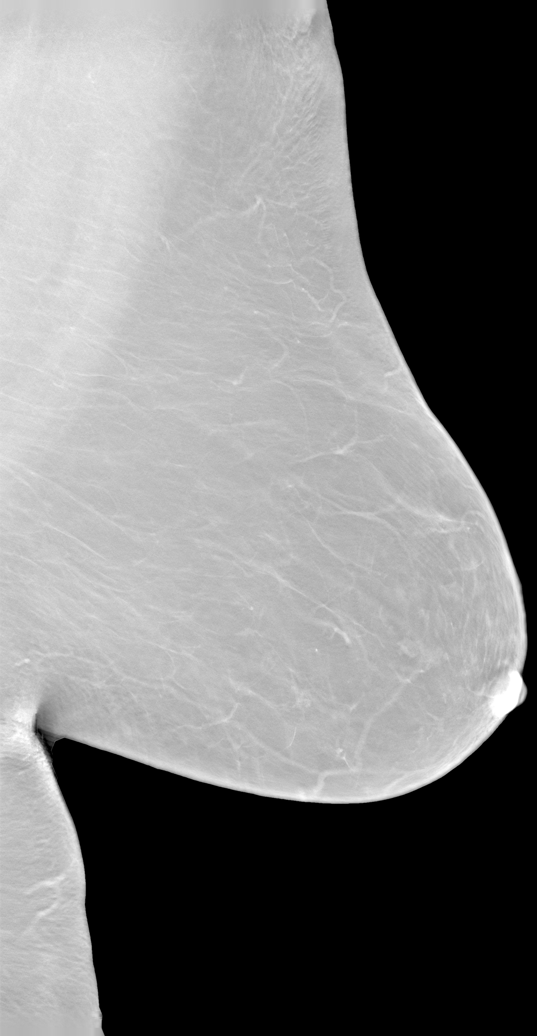

[8 of 24 positions shown; findings below may reference images not displayed]

FINDINGS: There are scattered fibroglandular elements.  There is no mass or suspicious cluster of microcalcifications.   There is no architectural distortion, skin thickening or nipple retraction.
IMPRESSION: 1.  BIRADS 2-Benign findings. Patient has been added in a reminder system with a target date for the next screening mammography.

2.  DENSITY CODE –  B (Scattered areas of fibroglandular density) 

Final Assessment Code:

BI-RADS 0
 Need additional imaging evaluation.

BI-RADS 1
 Negative mammogram.

BI-RADS 2
 Benign finding.

BI-RADS 3
 Probably benign finding; short-interval follow-up suggested.

BI-RADS 4
 Suspicious abnormality; biopsy should be considered.

BI-RADS 5
 Highly suggestive of malignancy; appropriate action should be taken.

BI-RADS 6
 Known biopsy-proven malignancy; appropriate action should be taken.

NOTE:
In compliance with Federal regulations, the results of this mammogram are being sent to the patient.

------------- REPORT [id number] -------------
[facility]
[address]
We wish to report the following on your recent mammography examination. We are sending a report to your referring physician or other health care provider.
FINDING: Normal-no evidence of cancer

This statement is mandated by the [place], Department of Health.
Your examination was performed by one of our technologists, who are registered radiological technologists and also specially certified in mammography:
___
[place], [place] (M)

Your mammogram was interpreted by our radiologist.

( 
Amore Pho, M.D.

(Annual Breast Examination by a physician or other health care provider
(Annual Mammography Screening beginning at age 40
(Monthly Breast Self Examination

## 2024-01-19 ENCOUNTER — Other Ambulatory Visit: Payer: Self-pay

## 2024-01-19 ENCOUNTER — Ambulatory Visit (INDEPENDENT_AMBULATORY_CARE_PROVIDER_SITE_OTHER): Payer: Self-pay | Admitting: Nephrology

## 2024-01-19 ENCOUNTER — Encounter (INDEPENDENT_AMBULATORY_CARE_PROVIDER_SITE_OTHER): Payer: Self-pay | Admitting: Nephrology

## 2024-01-19 VITALS — BP 137/78 | HR 107 | Ht 62.0 in | Wt 145.0 lb

## 2024-01-19 DIAGNOSIS — E559 Vitamin D deficiency, unspecified: Secondary | ICD-10-CM

## 2024-01-19 DIAGNOSIS — I7090 Unspecified atherosclerosis: Secondary | ICD-10-CM

## 2024-01-19 DIAGNOSIS — N261 Atrophy of kidney (terminal): Secondary | ICD-10-CM

## 2024-01-19 DIAGNOSIS — D631 Anemia in chronic kidney disease: Secondary | ICD-10-CM

## 2024-01-19 DIAGNOSIS — N184 Chronic kidney disease, stage 4 (severe): Secondary | ICD-10-CM

## 2024-01-19 DIAGNOSIS — I129 Hypertensive chronic kidney disease with stage 1 through stage 4 chronic kidney disease, or unspecified chronic kidney disease: Secondary | ICD-10-CM

## 2024-01-19 MED ORDER — EMPAGLIFLOZIN 10 MG TABLET
10.0000 mg | ORAL_TABLET | Freq: Every day | ORAL | 3 refills | Status: DC
Start: 2024-01-19 — End: 2024-01-19

## 2024-01-19 MED ORDER — EMPAGLIFLOZIN 10 MG TABLET
10.0000 mg | ORAL_TABLET | Freq: Every day | ORAL | 3 refills | Status: AC
Start: 2024-01-19 — End: 2025-01-18

## 2024-01-19 NOTE — Progress Notes (Signed)
 [facility]  [address] [place] [address]    Progress Note    Name: Loretta Crawford MRN:  [id number]   Date: 01/19/2024 DOB:  11-29-1940 (83 y.o.)              Nephrology Follow Up Visit          HPI: 83 y.o. female very pleasant with past medical history of chronic kidney disease stage 4, patient is here for follow-up.  Patient has previous baseline around 2.3 GFR 21.  Patient is feeling well today.    Patient denies dysuria.  Denies blood in the urine.  Discussed with the patient take Jardiance .  Her previous ultrasound-guided left atrophic kidney we discussed that with her.      01/12/2024 hemoglobin 14.7 glucose 97 creatinine 1.91 GFR 26 potassium 4.4 calcium 10.4 total protein to creatinine ratio is 89 magnesium 1.7 uric acid 6.1 8  Urine Albumin creatinine ratio is 4  Lab work on 08/11/2023 creatinine 1.62 GFR 32 carbon dioxide 24 albumin 4.1 albumin to creatinine ratio not significant A1c 5.8 TSH 1.2 vitamin-D 91.2 uric acid 6.3 phosphorus 3.9 PTH 29, calcium 10.2 with a normal albumin 4.1.        ROS:         Systematic review of 12 organ systems was negative except what mentioned in in the HPI.      OBJECTIVE:   BP 137/78   Pulse (!) 107   Ht 1.575 m (5\' 2" )   Wt 65.8 kg (145 lb)   BMI 26.52 kg/m       General:  NAD, AAOx3  HEENT:  EOMI, MMM, no pallor, no icterus  NECK: No increased JVD.    HEART: Normal S1 and S2. Regular rhythm. No murmurs or rubs.   LUNGS: Clear to auscultation bilateral. No wheezes, rales, or rhonchi.   ABDOMEN: +BS, Soft, nontender and nondistended. No rebound or guarding present.   EXTREMITIES: No edema. No asterixis          LABORATORY DATA:   Lab Results   Component Value Date    BUN 45 (H) 12/26/2022    CREATININE 2.32 (H) 12/26/2022    BUNCRRATIO 19 12/26/2022    GFR 21 (L) 12/26/2022    SODIUM 135 (L) 12/26/2022    POTASSIUM 4.7 12/26/2022    CHLORIDE 102 12/26/2022    CO2 25 12/26/2022    ANIONGAP 8 12/26/2022    CALCIUM 11.6 (H) 12/26/2022     PHOSPHORUS 3.6 (L) 12/26/2022    ALBUMIN 4.2 12/26/2022    HGB 14.1 12/26/2022    HCT 42.7 (H) 12/26/2022    INTACTPTH 16.9 12/26/2022    IRON 102 12/26/2022    IRONBINDCAP 329 12/26/2022    IRONSAT 31 12/26/2022       Lab Results   Component Value Date    HA1C 5.6 12/26/2022    URICACID 6.7 12/26/2022          MEDICATIONS:  Outpatient Medications Marked as Taking for the 01/19/24 encounter (Office Visit) with Leary Provencal, MD   Medication Sig    empagliflozin  (JARDIANCE ) 10 mg Oral Tablet Take 1 Tablet (10 mg total) by mouth Daily     Current Outpatient Medications   Medication Instructions    apixaban (ELIQUIS) 5 mg Oral Tablet Oral, 2 TIMES DAILY    empagliflozin  (JARDIANCE ) 10 mg, Oral, Daily    lisinopriL (PRINIVIL) 20 mg, Oral, Daily    pravastatin (PRAVACHOL) 10  mg Oral Tablet          ASSESSMENT / PLAN:   ENCOUNTER DIAGNOSES     ICD-10-CM   1. CKD (chronic kidney disease) stage 4, GFR 15-29 ml/min (CMS HCC)  N18.4   2. Vitamin D  deficiency  E55.9               Chronic kidney disease  -Stage 4  -Due to atrophic left kidney , atherosclerotic vascular disease and aging  -Creatinine is stable  1.91 GFR 26 on 01/12/2024  Lab Results   Component Value Date    CREATININE 2.32 (H) 12/26/2022        -Baseline creatinine 2.3/GFR 21  -Total protein to creatinine ratio-  Lab Results   Component Value Date    UPCR 0.083 12/26/2022      -UACR for  -CBC and a basic metabolic panel  -Return to clinic in 4 months  -Continue low-sodium diet  -balanced Fluid intake 40-50 oz a day.    -Avoid NSAIDs.  Tylenol only for pain  -Renal imaging severe atrophic left kidney  -ACEI or ARBS:  yes  -Sodium Glucose Cotransporter-2 (SGLT2) Inhibitors:  Add Jardiance     CKD bone mineral disease  Lab Results   Component Value Date    INTACTPTH 16.9 12/26/2022    PHOSPHORUS 3.6 (L) 12/26/2022   -hypercalcemia  improving calcium is 10.4    Hypertension  -Blood pressure is at goal  -Goal less than 130/80  -Continue lisinopril  -Low-salt  diet          Anemia of CKD   - HB   Lab Results   Component Value Date    HGB 14.1 12/26/2022              Orders Placed This Encounter    BASIC METABOLIC PANEL    CBC/DIFF    MAGNESIUM    MICROALBUMIN/CREATININE RATIO, URINE, RANDOM    PARATHYROID  HORMONE (PTH)    PROTEIN/CREATININE RATIO, URINE, RANDOM    URIC ACID    VITAMIN D  25 TOTAL    empagliflozin  (JARDIANCE ) 10 mg Oral Tablet

## 2024-05-20 ENCOUNTER — Encounter (INDEPENDENT_AMBULATORY_CARE_PROVIDER_SITE_OTHER): Payer: Self-pay | Admitting: Nephrology

## 2024-05-20 ENCOUNTER — Other Ambulatory Visit: Payer: Self-pay

## 2024-05-20 ENCOUNTER — Ambulatory Visit (INDEPENDENT_AMBULATORY_CARE_PROVIDER_SITE_OTHER): Payer: Self-pay | Admitting: Nephrology

## 2024-05-20 VITALS — BP 134/65 | HR 80 | Ht 62.0 in | Wt 154.0 lb

## 2024-05-20 DIAGNOSIS — I129 Hypertensive chronic kidney disease with stage 1 through stage 4 chronic kidney disease, or unspecified chronic kidney disease: Secondary | ICD-10-CM

## 2024-05-20 DIAGNOSIS — I7090 Unspecified atherosclerosis: Secondary | ICD-10-CM

## 2024-05-20 DIAGNOSIS — N261 Atrophy of kidney (terminal): Secondary | ICD-10-CM

## 2024-05-20 DIAGNOSIS — N184 Chronic kidney disease, stage 4 (severe): Secondary | ICD-10-CM

## 2024-05-20 DIAGNOSIS — N1832 Chronic kidney disease, stage 3b: Secondary | ICD-10-CM

## 2024-05-20 DIAGNOSIS — D631 Anemia in chronic kidney disease: Secondary | ICD-10-CM

## 2024-05-20 DIAGNOSIS — E559 Vitamin D deficiency, unspecified: Secondary | ICD-10-CM

## 2024-05-20 NOTE — Progress Notes (Signed)
 NEPHROLOGY, [facility]  [address]  [place] [address]    Progress Note    Name: Loretta Crawford MRN:  [id number]   Date: 05/20/2024 DOB:  [birth year] (83 y.o.)              Nephrology Follow Up Visit          HPI: 83 y.o. female very pleasant with past medical history of chronic kidney disease stage 4, patient is here for follow-up.  Patient has previous baseline around 2.3 GFR 21.  Patient is feeling well today.    Patient denies dysuria.  Denies blood in the urine.  Tolerating Jardiance .  Her previous ultrasound-guided left atrophic kidney we discussed that with her.  Does not have any new complaints    05/13/2024 hemoglobin 14.1 glucose 92 BUN 13 creatinine 1.39 GFR 38 sodium 141 potassium 3.8 chloride 102 carbon dioxide 22 calcium 9.6 total protein creatinine ratio is 0.164, albumin creatinine ratio 17 vitamin-D 63 uric acid 3.8 magnesium 1.8 PTH 32  01/12/2024 hemoglobin 14.7 glucose 97 creatinine 1.91 GFR 26 potassium 4.4 calcium 10.4 total protein to creatinine ratio is 89 magnesium 1.7 uric acid 6.1 8  Urine Albumin creatinine ratio is 4  Lab work on 08/11/2023 creatinine 1.62 GFR 32 carbon dioxide 24 albumin 4.1 albumin to creatinine ratio not significant A1c 5.8 TSH 1.2 vitamin-D 91.2 uric acid 6.3 phosphorus 3.9 PTH 29, calcium 10.2 with a normal albumin 4.1.        ROS:         Systematic review of 12 organ systems was negative except what mentioned in in the HPI.      OBJECTIVE:   BP 134/65   Pulse 80   Ht 1.575 m (5' 2)   Wt 69.9 kg (154 lb)   BMI 28.17 kg/m       General:  NAD, AAOx3  HEENT:  EOMI, MMM, no pallor, no icterus  NECK: No increased JVD.    HEART: Normal S1 and S2. Regular rhythm. No murmurs or rubs.   LUNGS: Clear to auscultation bilateral. No wheezes, rales, or rhonchi.   ABDOMEN: +BS, Soft, nontender and nondistended. No rebound or guarding present.   EXTREMITIES: No edema. No asterixis          LABORATORY DATA:   Lab Results   Component Value Date    BUN  45 (H) 12/26/2022    CREATININE 2.32 (H) 12/26/2022    BUNCRRATIO 19 12/26/2022    GFR 21 (L) 12/26/2022    SODIUM 135 (L) 12/26/2022    POTASSIUM 4.7 12/26/2022    CHLORIDE 102 12/26/2022    CO2 25 12/26/2022    ANIONGAP 8 12/26/2022    CALCIUM 11.6 (H) 12/26/2022    PHOSPHORUS 3.6 (L) 12/26/2022    ALBUMIN 4.2 12/26/2022    HGB 14.1 12/26/2022    HCT 42.7 (H) 12/26/2022    INTACTPTH 16.9 12/26/2022    IRON 102 12/26/2022    IRONBINDCAP 329 12/26/2022    IRONSAT 31 12/26/2022       Lab Results   Component Value Date    HA1C 5.6 12/26/2022    URICACID 6.7 12/26/2022          MEDICATIONS:  No outpatient medications have been marked as taking for the 05/20/24 encounter (Office Visit) with Helene Meter, MD.     Current Outpatient Medications   Medication Instructions    amLODIPine  (NORVASC) 5 mg Oral Tablet     apixaban (  ELIQUIS) 5 mg Oral Tablet Oral, 2 TIMES DAILY    empagliflozin  (JARDIANCE ) 10 mg, Oral, Daily    lisinopriL (PRINIVIL) 20 mg, Oral, Daily    pravastatin (PRAVACHOL) 10 mg Oral Tablet          ASSESSMENT / PLAN:   ENCOUNTER DIAGNOSES     ICD-10-CM   1. 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 Vitamin D  deficiency  E55.9               Chronic kidney disease  -Stage 4  -Due to atrophic left kidney , atherosclerotic vascular disease and aging  -Creatinine is better 1.91 GFR 26, 1.39 GFR 38  Lab Results   Component Value Date    CREATININE 2.32 (H) 12/26/2022        -Baseline creatinine 2.3/GFR 21  -Total protein to creatinine ratio-0.16  Lab Results   Component Value Date    UPCR 0.083 12/26/2022      -UACR 17  -CBC and a basic metabolic panel  -Return to clinic in 6 months  -Continue low-sodium diet  -balanced Fluid intake 40-50 oz a day.    -Avoid NSAIDs.  Tylenol only for pain  -Renal imaging severe atrophic left kidney  -ACEI or [facility]:  yes lisinopril  -Sodium Glucose Cotransporter-2 (SGLT2) Inhibitors:  Jardiance     CKD bone mineral disease  Lab Results   Component Value Date    INTACTPTH  16.9 12/26/2022    PHOSPHORUS 3.6 (L) 12/26/2022   -hypercalcemia  improving calcium is 9.6  -PTH and vitamin-D levels are at goal 63/32  Hypertension  -Blood pressure is at goal  -Goal less than 130/80  -Continue lisinopril  -Low-salt diet          Anemia of CKD   - HB   Lab Results   Component Value Date    HGB 14.1 12/26/2022              Orders Placed This Encounter    BASIC METABOLIC PANEL    CBC/DIFF    MAGNESIUM    MICROALBUMIN/CREATININE RATIO, URINE, RANDOM    PARATHYROID  HORMONE (PTH)    PROTEIN/CREATININE RATIO, URINE, RANDOM    URIC ACID    VITAMIN D  25 TOTAL

## 2024-11-22 ENCOUNTER — Encounter (INDEPENDENT_AMBULATORY_CARE_PROVIDER_SITE_OTHER): Payer: Self-pay | Admitting: Nephrology

## 2024-11-22 ENCOUNTER — Encounter (INDEPENDENT_AMBULATORY_CARE_PROVIDER_SITE_OTHER): Payer: Self-pay

## 2025-03-31 ENCOUNTER — Encounter (INDEPENDENT_AMBULATORY_CARE_PROVIDER_SITE_OTHER): Payer: Self-pay | Admitting: Nephrology
